# Patient Record
Sex: Female | Born: 1990 | Hispanic: Yes | Marital: Single | Smoking: Former smoker
Health system: Southern US, Community
[De-identification: ages and names within clinical notes are randomized; demographics above are authoritative.]

---
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
## 2014-10-18 ENCOUNTER — Emergency Department: Payer: Self-pay | Admitting: Internal Medicine

## 2014-10-18 LAB — URINALYSIS, COMPLETE
BILIRUBIN, UR: NEGATIVE
Blood: NEGATIVE
Ketone: NEGATIVE
Leukocyte Esterase: NEGATIVE
NITRITE: NEGATIVE
Ph: 7 (ref 4.5–8.0)
Protein: NEGATIVE
RBC,UR: 4 /HPF (ref 0–5)
SPECIFIC GRAVITY: 1.023 (ref 1.003–1.030)
Squamous Epithelial: 4

## 2014-10-18 LAB — CBC
HCT: 38.8 % (ref 35.0–47.0)
HGB: 13.1 g/dL (ref 12.0–16.0)
MCH: 30.9 pg (ref 26.0–34.0)
MCHC: 33.7 g/dL (ref 32.0–36.0)
MCV: 92 fL (ref 80–100)
Platelet: 243 10*3/uL (ref 150–440)
RBC: 4.23 10*6/uL (ref 3.80–5.20)
RDW: 13.3 % (ref 11.5–14.5)
WBC: 6.3 10*3/uL (ref 3.6–11.0)

## 2014-10-18 LAB — HCG, QUANTITATIVE, PREGNANCY: BETA HCG, QUANT.: 39586 m[IU]/mL — AB

## 2014-10-20 LAB — URINE CULTURE

## 2014-12-02 ENCOUNTER — Observation Stay: Payer: Self-pay | Admitting: Obstetrics & Gynecology

## 2014-12-31 ENCOUNTER — Inpatient Hospital Stay
Admit: 2014-12-31 | Disposition: A | Discharge status: Other Acute Inpatient Hospital | Payer: Self-pay | Attending: Certified Nurse Midwife | Admitting: Certified Nurse Midwife

## 2014-12-31 LAB — CBC WITH DIFFERENTIAL/PLATELET
BASOS PCT: 0.2 %
Basophil #: 0 10*3/uL (ref 0.0–0.1)
EOS PCT: 1.9 %
Eosinophil #: 0.2 10*3/uL (ref 0.0–0.7)
HCT: 38.2 % (ref 35.0–47.0)
HGB: 12.9 g/dL (ref 12.0–16.0)
Lymphocyte #: 1.4 10*3/uL (ref 1.0–3.6)
Lymphocyte %: 14.6 %
MCH: 30.8 pg (ref 26.0–34.0)
MCHC: 33.8 g/dL (ref 32.0–36.0)
MCV: 91 fL (ref 80–100)
MONO ABS: 0.5 x10 3/mm (ref 0.2–0.9)
MONOS PCT: 5.2 %
NEUTROS PCT: 78.1 %
Neutrophil #: 7.3 10*3/uL — ABNORMAL HIGH (ref 1.4–6.5)
Platelet: 253 10*3/uL (ref 150–440)
RBC: 4.2 10*6/uL (ref 3.80–5.20)
RDW: 13.8 % (ref 11.5–14.5)
WBC: 9.4 10*3/uL (ref 3.6–11.0)

## 2015-01-09 ENCOUNTER — Observation Stay
Admit: 2015-01-09 | Disposition: A | Discharge status: Other Acute Inpatient Hospital | Payer: Self-pay | Attending: Advanced Practice Midwife | Admitting: Advanced Practice Midwife

## 2015-01-09 ENCOUNTER — Ambulatory Visit (HOSPITAL_COMMUNITY)
Admission: AD | Admit: 2015-01-09 | Discharge: 2015-01-09 | Disposition: A | Discharge status: Home / Self Care | Payer: Medicaid Other | Source: Other Acute Inpatient Hospital | Attending: Obstetrics and Gynecology | Admitting: Obstetrics and Gynecology

## 2015-01-09 DIAGNOSIS — Z3A00 Weeks of gestation of pregnancy not specified: Secondary | ICD-10-CM | POA: Diagnosis not present

## 2015-01-09 DIAGNOSIS — O4410 Placenta previa with hemorrhage, unspecified trimester: Secondary | ICD-10-CM | POA: Diagnosis present

## 2015-01-09 LAB — CBC WITH DIFFERENTIAL/PLATELET
BASOS ABS: 0 10*3/uL (ref 0.0–0.1)
Basophil %: 0.5 %
EOS ABS: 0.1 10*3/uL (ref 0.0–0.7)
EOS PCT: 1.3 %
HCT: 38.6 % (ref 35.0–47.0)
HGB: 13.1 g/dL (ref 12.0–16.0)
LYMPHS PCT: 14.5 %
Lymphocyte #: 1.5 10*3/uL (ref 1.0–3.6)
MCH: 30.9 pg (ref 26.0–34.0)
MCHC: 34.1 g/dL (ref 32.0–36.0)
MCV: 91 fL (ref 80–100)
MONO ABS: 0.6 x10 3/mm (ref 0.2–0.9)
MONOS PCT: 6.2 %
NEUTROS ABS: 8 10*3/uL — AB (ref 1.4–6.5)
Neutrophil %: 77.5 %
Platelet: 255 10*3/uL (ref 150–440)
RBC: 4.26 10*6/uL (ref 3.80–5.20)
RDW: 13.4 % (ref 11.5–14.5)
WBC: 10.3 10*3/uL (ref 3.6–11.0)

## 2015-02-04 NOTE — H&P (Signed)
L&D Evaluation:  History Expanded:  HPI Pt is a 24 yo G1 with EDD=04/10/2015 by a 15wk5d ultrasound, presents at 27w 0d with c/o passing blood clot this am and mild cramping last night and this morning. Denies cramping currently. No LOF, ctx or decreased FM. Pt has a complete placenta previa and this is her third presentation to L&D for bleeding. She was seen most recently on April 5th at 227w5d, received steroids and was [facility]. She signed out AMA on April 8th as she had not had further bleeding and she felt that she was okay to go home, despite risks being reviewed with her at length. She had her first episode of bleeding at 21 wk5 days. She has been on pelvic rest since her first episiode of bleeding. She denies recent intercourse. Her prenatal course is also significant for late entry to care, obesity with a BMI of 35, and smoker. Hx of alcohol abuse and DUI in 12/2012. She is AB+, RNI, VI.   Presents with vaginal bleeding   Patient's Medical History No Chronic Illness   Patient's Surgical History none   Medications Pre Natal Vitamins   Allergies NKDA   Social History ETOH prior to pregnancy./ Quit smoking prior to pregnancy   Family History Non-Contributory   ROS:  ROS see HPI   Exam:  Vital Signs stable   Urine Protein not completed   General no apparent distress   Mental Status clear   Heart no murmur/gallop/rubs   Abdomen gravid, non-tender   Edema no edema   Reflexes +1 to +2   Pelvic no external lesions, SSE: moderate amt dark red blood and 1 cm clot in vaginal vault and coming from the cervix. CX appears to be closed.   Mebranes Intact   FHT normal rate with no decels, baseline 150, appropriate for gestational age   Ucx absent   Skin dry   Impression:  Impression IUP at 27 weeks, complete placenta previa, 3rd bleeding episode   Plan:  Comments Discussed possible transfer again to [facility], pt resistant as it's "so far from family and I have to  be there by myself".  Again reviewed risks of bleeding to fetus and the possible need for emergency delivery in the event of heavy bleeding. Will review with Dr Vergie LivingPickens to further discuss possible POM.   Electronic Signatures: Masae Lukacs, Marta Lamasamara K (CNM)  (Signed 14-Apr-16 12:58)  Authored: L&D Evaluation   Last Updated: 14-Apr-16 12:58 by Vella KohlerBrothers, Tarquin Welcher K (CNM)

## 2015-02-04 NOTE — H&P (Signed)
L&D Evaluation:  History:  HPI Pt is a 24 yo G1 at 25wk5d GA ,  EDC=04/10/2015 by a 15wk5d ultrasound with a hx of complete placenta previa who presents to L&D with a second episode of bleeding. She had her first episode of bleeding at 21 wk5 days.  She reports bright red blood on her pad this Am when she awoke to go to [facility]. She also had blood on the toliet tissue when she wiped and a small amt of blood in the toliet.  When she arrived, there was blood on the middle part of her pad and she could feel the blood trickle out.  She denies intercourse or  trauma. She treated herself for a yeast infection with Monistat 1 a couple of weeks ago. Has been having a brownish yellowish discharge.  She has been on pelvic rest since her first episiode of bleeding. She denies any abdominal pain or ctxs. Baby active. Her prenatal course is also significant for late entry to care, obesity with a BMI of 35, and smoker. Hx of alcohol abuse and DUI in 12/2012. Attending classes to have her liscense reinstated. She is AB+, RNI, VI.   Presents with vaginal bleeding   Patient's Medical History No Chronic Illness   Patient's Surgical History none   Medications Pre Natal Vitamins   Allergies NKDA   Social History tobacco  ETOH prior to pregnancy./ Quit smoking prior to pregnancy   Family History Non-Contributory   ROS:  ROS see HPI   Exam:  Vital Signs 126/75 98.1-99-18    Urine Protein not completed   General no apparent distress   Mental Status clear   Chest clear    Heart normal sinus rhythm, no murmur/gallop/rubs   Abdomen gravid, non-tender   Fetal Position cephalic on US. Complete previa again visualized   Edema no edema    Reflexes +1 to +2    Pelvic no external lesions, SSE: small amt dark red blood in vaginal vault and coming from the cxs. CX appears to be closed.   Mebranes Intact   FHT normal rate with no decels, 140, age appropriate tracing   Ucx absent   Skin dry   Other wet  prep negative.   Impression:  Impression IUP at 25.5 weeks with complete previa and second episode of bleeding.. Stable   Plan:  Plan EFM/NST, monitor bleeding-consider transfer to tertiery center. CBC, KB, T&S done.  IV started and a second saline lock inserted.  Steroids begun.   Comments Had discussion with patient regarding bleeding from placenta previa and significance. Aware that delivery would be via a C-section if previa persists, and that often bleeding may necessitate premature delivery. Discussed some of the problems with prematurity for the baby especially at such an early gestation and the benefits of steroids for the production of surfactant. Discussed that often transfer to a tertiery center with higher level NICU and the ability to do a C-section within a few minutes is indicated at this early gestation.   Electronic Signatures: Trinna BalloonGutierrez, Yeiren Whitecotton L (CNM)  (Signed 05-Apr-16 10:06)  Authored: L&D Evaluation   Last Updated: 05-Apr-16 10:06 by Trinna BalloonGutierrez, Vonita Calloway L (CNM)

## 2015-02-04 NOTE — H&P (Signed)
L&D Evaluation:  History:  HPI Pt is a 24 yo G1 at 3858w4d GA pt of [facility] with an EDC of 04/10/15 based off of a 2930w5d u/s who presents to L&D with reports of bleeding on and off for 2 weeks. She reports bright red blood on her pad. When she arrived, she had 2 silver dollars worth of blood on a pad as well as blood in the toilet on the toilet paper when she wiped. She denies intercourse, trauma, or infection. She had an ultrasound in January that showed a complete placenta previa. Her prenatal course is also significant for late entry to care, obesity with a BMI of 35, and smoker. She is AB+, RNI, VI.   Presents with vaginal bleeding   Patient's Medical History No Chronic Illness   Patient's Surgical History none   Medications Pre Natal Vitamins   Allergies NKDA   Social History tobacco   Family History Non-Contributory   ROS:  ROS All systems were reviewed.  HEENT, CNS, GI, GU, Respiratory, CV, Renal and Musculoskeletal systems were found to be normal.   Exam:  Vital Signs stable   General no apparent distress   Mental Status clear   Chest clear   Heart normal sinus rhythm   Abdomen gravid, non-tender   Back no CVAT   Pelvic no external lesions, appears closed on u/s- was not measured by sonographer   Mebranes Intact   FHT 145 on doppler   Ucx absent   Skin dry, no lesions, no rashes   Lymph no lymphadenopathy   Other During u/s pt had some blood in the toilet and when she wiped that " as a lot" as noted by the u/s tech. She then had a golf ball sized amount of bleeding on her pad in L&D. Since 3 am she has not had any more bleeding on her pad as reported by the RN.   Impression:  Impression IUP at 2658w4d, complete placenta previa with vaginal bleeding   Plan:  Plan monitor for increase in vaginal bleeding. COnsider discharge when bleeding is stable   Follow Up Appointment need to schedule   Electronic Signatures: Jannet MantisSubudhi, Lovett Coffin (CNM)  (Signed 08-Mar-16  07:01)  Authored: L&D Evaluation   Last Updated: 08-Mar-16 07:01 by Jannet MantisSubudhi, Abrahim Sargent (CNM)

## 2017-03-12 ENCOUNTER — Observation Stay
Admission: EM | Admit: 2017-03-12 | Discharge: 2017-03-12 | Disposition: A | Discharge status: Home / Self Care | Payer: BLUE CROSS/BLUE SHIELD | Attending: Obstetrics & Gynecology | Admitting: Obstetrics & Gynecology

## 2017-03-12 DIAGNOSIS — Z3490 Encounter for supervision of normal pregnancy, unspecified, unspecified trimester: Secondary | ICD-10-CM

## 2017-03-12 DIAGNOSIS — Z3A23 23 weeks gestation of pregnancy: Secondary | ICD-10-CM | POA: Diagnosis not present

## 2017-03-12 DIAGNOSIS — O9989 Other specified diseases and conditions complicating pregnancy, childbirth and the puerperium: Secondary | ICD-10-CM

## 2017-03-12 DIAGNOSIS — R1030 Lower abdominal pain, unspecified: Secondary | ICD-10-CM | POA: Diagnosis not present

## 2017-03-12 DIAGNOSIS — O26899 Other specified pregnancy related conditions, unspecified trimester: Secondary | ICD-10-CM | POA: Diagnosis present

## 2017-03-12 DIAGNOSIS — R109 Unspecified abdominal pain: Secondary | ICD-10-CM

## 2017-03-12 LAB — URINALYSIS, ROUTINE W REFLEX MICROSCOPIC
Bilirubin Urine: NEGATIVE
GLUCOSE, UA: NEGATIVE mg/dL
Hgb urine dipstick: NEGATIVE
Ketones, ur: 5 mg/dL — AB
Nitrite: NEGATIVE
PH: 6 (ref 5.0–8.0)
Protein, ur: 30 mg/dL — AB
Specific Gravity, Urine: 1.024 (ref 1.005–1.030)

## 2017-03-12 MED ORDER — ACETAMINOPHEN 325 MG PO TABS: 650.0000 mg | ORAL_TABLET | ORAL | Status: DC | PRN

## 2017-03-12 MED ORDER — ONDANSETRON HCL 4 MG/2ML IJ SOLN: 4.0000 mg | Freq: Four times a day (QID) | INTRAMUSCULAR | Status: DC | PRN

## 2017-03-12 NOTE — Discharge Summary (Signed)
  See FPN 

## 2017-03-12 NOTE — OB Triage Note (Signed)
Patient presents to triage for complaints of lower abdominal pain, states that this pain is intermittent and lasts for abut 30 -40 seconds, is shooting all in lower abdomen. No leaking of fluids, no bleeding, fetal movement has been good.

## 2017-03-12 NOTE — Final Progress Note (Signed)
Physician Final Progress Note  Patient ID: Raven SonsBrenda Knippel MRN: [id number] DOB/AGE: [birth year] 26 y.o.  Admit date: 03/12/2017 Admitting provider: Nadara Mustardobert P Renee Beale, MD Discharge date: 03/12/2017   Admission Diagnoses: Lower abdominal pain, [redacted] weeks pregnant  Discharge Diagnoses:  Active Problems:   Pregnancy   Abdominal pain affecting pregnancy   Consults: None  Significant Findings/ Diagnostic Studies: Pt seen and examined by nurse.  Patient presented for evaluation of pain or labor.  Patient had  exam by RN and this was reported to me. I reviewed her vital signs and fetal tracing, both of which were reassuring.  UA performed with min WBC's.  Patient was discharge as she was not laboring.  Procedures: FHT 140s  Discharge Condition: good  Disposition: 01-Home or Self Care  Diet: Regular diet  Discharge Activity: Activity as tolerated   Allergies as of 03/12/2017   No Known Allergies     Medication List    TAKE these medications   prenatal vitamin w/FE, FA 27-1 MG Tabs tablet Take 1 tablet by mouth daily at 12 noon.      Follow-up Information    Department, [facility] Follow up.   Why:  as scheduled   Contact information: [address] [place] [id number] [phone]           Total time spent taking care of this patient: TRIAGE  Signed: Letitia LibraRobert Paul Siaosi Alter 03/12/2017, 7:56 PM

## 2017-09-28 ENCOUNTER — Encounter (HOSPITAL_COMMUNITY): Payer: Self-pay

## 2017-12-14 ENCOUNTER — Encounter: Payer: Self-pay | Admitting: Certified Nurse Midwife

## 2017-12-24 ENCOUNTER — Emergency Department
Admission: EM | Admit: 2017-12-24 | Discharge: 2017-12-24 | Disposition: A | Discharge status: Home / Self Care | Payer: BLUE CROSS/BLUE SHIELD | Attending: Emergency Medicine | Admitting: Emergency Medicine

## 2017-12-24 DIAGNOSIS — K2900 Acute gastritis without bleeding: Secondary | ICD-10-CM | POA: Diagnosis not present

## 2017-12-24 DIAGNOSIS — Z87891 Personal history of nicotine dependence: Secondary | ICD-10-CM | POA: Insufficient documentation

## 2017-12-24 DIAGNOSIS — R1010 Upper abdominal pain, unspecified: Secondary | ICD-10-CM

## 2017-12-24 LAB — CBC
HEMATOCRIT: 43.3 % (ref 35.0–47.0)
HEMOGLOBIN: 14.8 g/dL (ref 12.0–16.0)
MCH: 30.5 pg (ref 26.0–34.0)
MCHC: 34.1 g/dL (ref 32.0–36.0)
MCV: 89.3 fL (ref 80.0–100.0)
Platelets: 268 10*3/uL (ref 150–440)
RBC: 4.86 MIL/uL (ref 3.80–5.20)
RDW: 13 % (ref 11.5–14.5)
WBC: 9.1 10*3/uL (ref 3.6–11.0)

## 2017-12-24 LAB — COMPREHENSIVE METABOLIC PANEL
ALBUMIN: 4.1 g/dL (ref 3.5–5.0)
ALT: 39 U/L (ref 14–54)
ANION GAP: 9 (ref 5–15)
AST: 24 U/L (ref 15–41)
Alkaline Phosphatase: 53 U/L (ref 38–126)
BILIRUBIN TOTAL: 0.5 mg/dL (ref 0.3–1.2)
BUN: 17 mg/dL (ref 6–20)
CO2: 22 mmol/L (ref 22–32)
Calcium: 9 mg/dL (ref 8.9–10.3)
Chloride: 106 mmol/L (ref 101–111)
Creatinine, Ser: 0.42 mg/dL — ABNORMAL LOW (ref 0.44–1.00)
GFR calc Af Amer: >60 mL/min (ref >60)
Glucose, Bld: 114 mg/dL — ABNORMAL HIGH (ref 65–99)
POTASSIUM: 3.8 mmol/L (ref 3.5–5.1)
Sodium: 137 mmol/L (ref 135–145)
TOTAL PROTEIN: 7.5 g/dL (ref 6.5–8.1)

## 2017-12-24 LAB — URINALYSIS, COMPLETE (UACMP) WITH MICROSCOPIC
BACTERIA UA: NONE SEEN
BILIRUBIN URINE: NEGATIVE
Glucose, UA: NEGATIVE mg/dL
HGB URINE DIPSTICK: NEGATIVE
Ketones, ur: NEGATIVE mg/dL
LEUKOCYTES UA: NEGATIVE
NITRITE: NEGATIVE
PROTEIN: NEGATIVE mg/dL
Specific Gravity, Urine: 1.029 (ref 1.005–1.030)
pH: 6 (ref 5.0–8.0)

## 2017-12-24 LAB — LIPASE, BLOOD: Lipase: 34 U/L (ref 11–51)

## 2017-12-24 LAB — POCT PREGNANCY, URINE: PREG TEST UR: NEGATIVE

## 2017-12-24 MED ORDER — FAMOTIDINE 40 MG PO TABS: 40.0000 mg | ORAL_TABLET | Freq: Every evening | ORAL | 0 refills | Status: AC

## 2017-12-24 MED ORDER — GI COCKTAIL ~~LOC~~
30.0000 mL | Freq: Once | ORAL | Status: AC
  Administered 2017-12-24: 30 mL via ORAL
  Filled 2017-12-24: qty 30

## 2017-12-24 NOTE — ED Provider Notes (Signed)
[facility] Emergency Department Provider Note  ____________________________________________   First MD Initiated Contact with Patient 12/24/17 [phone]     (approximate)  I have reviewed the triage vital signs and the nursing notes.   HISTORY  Chief Complaint Abdominal Pain   HPI Raven Simpson is a 27 y.o. female without any chronic medical conditions was presenting to the emergency department today with upper abdominal pain that started hours ago.  She says that the pain is a 9 out of 10 burning sensation to the epigastrium that radiates through to her back as well as to the bilateral flanks.  She denies any burning with urination.  Says that she is nauseous but is not vomiting.  Denies any diarrhea.  Does not report any vaginal bleeding or discharge.  Denies history of reflux but does say that the pain worsens when she lies back.  Denies the pain radiating up into her chest.  History reviewed. No pertinent past medical history.  Patient Active Problem List   Diagnosis Date Noted  . Pregnancy 03/12/2017  . Abdominal pain affecting pregnancy 03/12/2017    History reviewed. No pertinent surgical history.  Prior to Admission medications   Medication Sig Start Date End Date Taking? Authorizing Provider  prenatal vitamin w/FE, FA (PRENATAL 1 + 1) 27-1 MG TABS tablet Take 1 tablet by mouth daily at 12 noon.    [provider]    Allergies Patient has no known allergies.  No family history on file.  Social History Social History   Tobacco Use  . Smoking status: Former Games developermoker  . Smokeless tobacco: Never Used  Substance Use Topics  . Alcohol use: Not Currently  . Drug use: Not on file    Review of Systems  Constitutional: No fever/chills Eyes: No visual changes. ENT: No sore throat. Cardiovascular: Denies chest pain. Respiratory: Denies shortness of breath. Gastrointestinal:  no vomiting.  No diarrhea.  No constipation. Genitourinary:  Negative for dysuria. Musculoskeletal: As above Skin: Negative for rash. Neurological: Negative for headaches, focal weakness or numbness.   ____________________________________________   PHYSICAL EXAM:  VITAL SIGNS: ED Triage Vitals  Enc Vitals Group     BP 12/24/17 0611 (!) 136/93     Pulse Rate 12/24/17 0611 93     Resp 12/24/17 0611 16     Temp 12/24/17 0611 97.9 F (36.6 C)     Temp Source 12/24/17 0611 Oral     SpO2 12/24/17 0611 96 %     Weight 12/24/17 0600 185 lb (83.9 kg)     Height 12/24/17 0600 5\' 1"  (1.549 m)     Head Circumference --      Peak Flow --      Pain Score 12/24/17 0600 9     Pain Loc --      Pain Edu? --      Excl. in GC? --     Constitutional: Alert and oriented. Well appearing and in no acute distress. Eyes: Conjunctivae are normal.  Head: Atraumatic. Nose: No congestion/rhinnorhea. Mouth/Throat: Mucous membranes are moist.  Neck: No stridor.   Cardiovascular: Normal rate, regular rhythm. Grossly normal heart sounds.  Respiratory: Normal respiratory effort.  No retractions. Lungs CTAB. Gastrointestinal: Soft with moderate epigastric tenderness to palpation as well as mild to moderate tenderness to the right upper and left upper quadrants with a negative Murphy sign.  No lower abdominal tenderness to palpation.  No rebound or guarding. No distention. No CVA tenderness. Musculoskeletal: No lower extremity  tenderness nor edema.  No joint effusions. Neurologic:  Normal speech and language. No gross focal neurologic deficits are appreciated. Skin:  Skin is warm, dry and intact. No rash noted. Psychiatric: Mood and affect are normal. Speech and behavior are normal.  ____________________________________________   LABS (all labs ordered are listed, but only abnormal results are displayed)  Labs Reviewed  COMPREHENSIVE METABOLIC PANEL - Abnormal; Notable for the following components:      Result Value   Glucose, Bld 114 (*)    Creatinine, Ser  0.42 (*)    All other components within normal limits  URINALYSIS, COMPLETE (UACMP) WITH MICROSCOPIC - Abnormal; Notable for the following components:   Color, Urine YELLOW (*)    APPearance HAZY (*)    Squamous Epithelial / LPF 6-30 (*)    All other components within normal limits  LIPASE, BLOOD  CBC  POC URINE PREG, ED  POCT PREGNANCY, URINE   ____________________________________________  EKG  ED ECG REPORT I, Arelia Longest, the attending physician, personally viewed and interpreted this ECG.   Date: 12/24/2017  EKG Time: 0611  Rate: 85  Rhythm: normal sinus rhythm  Axis: Normal  Intervals:none  ST&T Change: No ST segment elevation or depression.  No abnormal T wave inversion.  ____________________________________________  RADIOLOGY   ____________________________________________   PROCEDURES  Procedure(s) performed:   Procedures  Critical Care performed:   ____________________________________________   INITIAL IMPRESSION / ASSESSMENT AND PLAN / ED COURSE  Pertinent labs & imaging results that were available during my care of the patient were reviewed by me and considered in my medical decision making (see chart for details).  Differential diagnosis includes, but is not limited to, biliary disease (biliary colic, acute cholecystitis, cholangitis, choledocholithiasis, etc), intrathoracic causes for epigastric abdominal pain including ACS, gastritis, duodenitis, pancreatitis, small bowel or large bowel obstruction, abdominal aortic aneurysm, hernia, and gastritis. As part of my medical decision making, I reviewed the following data within the electronic MEDICAL RECORD NUMBER Notes from prior ED visits  ----------------------------------------- 6:44 AM on 12/24/2017 -----------------------------------------  Patient is pain-free after the GI cocktail.  Says that she also burped several times and now can feel her stomach gurgling.  I reexamined her abdomen and  she is nontender throughout.  Possible gas pains versus bloating versus reflux.  Will be discharged home with Pepcid.  Will follow up with her primary care Dr. Phineas Real.  We discussed return precautions such as any return of her abdominal pain or for any worsening or concerning symptoms.  Patient is understanding of the diagnosis as well as treatment plan willing to comply. ____________________________________________   FINAL CLINICAL IMPRESSION(S) / ED DIAGNOSES  Gastritis.    NEW MEDICATIONS STARTED DURING THIS VISIT:  New Prescriptions   No medications on file     Note:  This document was prepared using Dragon voice recognition software and may include unintentional dictation errors.     Myrna Blazer, MD 12/24/17 [id number]

## 2017-12-24 NOTE — ED Notes (Signed)
Pt reports mid abdominal pain that radiates to her back.  Pt is alert and oriented x 4.

## 2017-12-24 NOTE — ED Triage Notes (Addendum)
Patient c/o burning epigastric pain radiating to back beginning at 0230 today. Patient reports nausea, denies vomiting.

## 2018-09-04 ENCOUNTER — Encounter: Payer: Self-pay | Admitting: Emergency Medicine

## 2018-09-04 ENCOUNTER — Other Ambulatory Visit: Payer: Self-pay

## 2018-09-04 ENCOUNTER — Emergency Department
Admission: EM | Admit: 2018-09-04 | Discharge: 2018-09-04 | Disposition: A | Discharge status: Home / Self Care | Payer: BLUE CROSS/BLUE SHIELD | Attending: Emergency Medicine | Admitting: Emergency Medicine

## 2018-09-04 ENCOUNTER — Emergency Department: Payer: BLUE CROSS/BLUE SHIELD

## 2018-09-04 DIAGNOSIS — R002 Palpitations: Secondary | ICD-10-CM | POA: Diagnosis present

## 2018-09-04 DIAGNOSIS — Z87891 Personal history of nicotine dependence: Secondary | ICD-10-CM | POA: Diagnosis not present

## 2018-09-04 DIAGNOSIS — F419 Anxiety disorder, unspecified: Secondary | ICD-10-CM | POA: Diagnosis not present

## 2018-09-04 DIAGNOSIS — Z79899 Other long term (current) drug therapy: Secondary | ICD-10-CM | POA: Diagnosis not present

## 2018-09-04 LAB — BASIC METABOLIC PANEL
Anion gap: 7 (ref 5–15)
BUN: 10 mg/dL (ref 6–20)
CO2: 23 mmol/L (ref 22–32)
Calcium: 9.1 mg/dL (ref 8.9–10.3)
Chloride: 106 mmol/L (ref 98–111)
Creatinine, Ser: 0.36 mg/dL — ABNORMAL LOW (ref 0.44–1.00)
GFR calc Af Amer: >60 mL/min (ref >60)
GLUCOSE: 102 mg/dL — AB (ref 70–99)
POTASSIUM: 3.5 mmol/L (ref 3.5–5.1)
Sodium: 136 mmol/L (ref 135–145)

## 2018-09-04 LAB — CBC
HCT: 45.6 % (ref 36.0–46.0)
Hemoglobin: 15.5 g/dL — ABNORMAL HIGH (ref 12.0–15.0)
MCH: 31.1 pg (ref 26.0–34.0)
MCHC: 34 g/dL (ref 30.0–36.0)
MCV: 91.4 fL (ref 80.0–100.0)
Platelets: 266 10*3/uL (ref 150–400)
RBC: 4.99 MIL/uL (ref 3.87–5.11)
RDW: 12.5 % (ref 11.5–15.5)
WBC: 9.5 10*3/uL (ref 4.0–10.5)
nRBC: 0 % (ref 0.0–0.2)

## 2018-09-04 LAB — TROPONIN I

## 2018-09-04 NOTE — ED Provider Notes (Signed)
[facility] Emergency Department Provider Note   ____________________________________________    I have reviewed the triage vital signs and the nursing notes.   HISTORY  Chief Complaint Palpitations     HPI Raven Simpson is a 27 y.o. female who presents with complaints of palpitation and anxiety.  Patient reports earlier today around noon she started feeling very anxious and emotional and then developed palpitations and "a sense of doom ".  No chest pain.  No shortness of breath.  Currently she feels much better and has no symptoms.  She reports this is been happening in the past and she does see psychiatry for anxiety which she thinks is the cause of these symptoms.  No calf pain, no pleurisy, no recent travel.   History reviewed. No pertinent past medical history.  Patient Active Problem List   Diagnosis Date Noted  . Pregnancy 03/12/2017  . Abdominal pain affecting pregnancy 03/12/2017    History reviewed. No pertinent surgical history.  Prior to Admission medications   Medication Sig Start Date End Date Taking? Authorizing Provider  famotidine (PEPCID) 40 MG tablet Take 1 tablet (40 mg total) by mouth every evening. 12/24/17 12/24/18  Schaevitz, Myra Rude, MD  prenatal vitamin w/FE, FA (PRENATAL 1 + 1) 27-1 MG TABS tablet Take 1 tablet by mouth daily at 12 noon.    [provider]     Allergies Patient has no known allergies.  No family history on file.  Social History Social History   Tobacco Use  . Smoking status: Former Games developer  . Smokeless tobacco: Never Used  Substance Use Topics  . Alcohol use: Not Currently  . Drug use: Not on file    Review of Systems  Constitutional: No fever/chills Eyes: No visual changes.  ENT: No sore throat. Cardiovascular: As above Respiratory: Denies shortness of breath. Gastrointestinal: No abdominal pain.   Genitourinary: Negative for dysuria. Musculoskeletal: Negative for back  pain. Skin: Negative for rash. Neurological: Negative for headaches    ____________________________________________   PHYSICAL EXAM:  VITAL SIGNS: ED Triage Vitals  Enc Vitals Group     BP 09/04/18 1615 123/86     Pulse Rate 09/04/18 1615 (!) 101     Resp 09/04/18 1615 20     Temp 09/04/18 1615 99.3 F (37.4 C)     Temp Source 09/04/18 1615 Oral     SpO2 09/04/18 1615 99 %     Weight 09/04/18 1615 86.2 kg (190 lb)     Height 09/04/18 1615 1.549 m (5\' 1" )     Head Circumference --      Peak Flow --      Pain Score 09/04/18 1619 0     Pain Loc --      Pain Edu? --      Excl. in GC? --     Constitutional: Alert and oriented. No acute distress. Pleasant and interactive Eyes: Conjunctivae are normal.   Nose: No congestion/rhinnorhea. Mouth/Throat: Mucous membranes are moist.    Cardiovascular: Normal rate, regular rhythm. Grossly normal heart sounds.  Good peripheral circulation. Respiratory: Normal respiratory effort.  No retractions. Lungs CTAB. Gastrointestinal: Soft and nontender. No distention.    Musculoskeletal: No lower extremity tenderness nor edema.  Warm and well perfused Neurologic:  Normal speech and language. No gross focal neurologic deficits are appreciated.  Skin:  Skin is warm, dry and intact. No rash noted. Psychiatric: Mood and affect are normal. Speech and behavior are normal.  ____________________________________________  LABS (all labs ordered are listed, but only abnormal results are displayed)  Labs Reviewed  BASIC METABOLIC PANEL - Abnormal; Notable for the following components:      Result Value   Glucose, Bld 102 (*)    Creatinine, Ser 0.36 (*)    All other components within normal limits  CBC - Abnormal; Notable for the following components:   Hemoglobin 15.5 (*)    All other components within normal limits  TROPONIN I   ____________________________________________  EKG  ED ECG REPORT I, Jene Everyobert Gen Clagg, the attending physician,  personally viewed and interpreted this ECG.  Date: 09/04/2018  Rhythm: normal sinus rhythm QRS Axis: normal Intervals: normal ST/T Wave abnormalities: normal Narrative Interpretation: no evidence of acute ischemia  ____________________________________________  RADIOLOGY  Chest x-ray normal ____________________________________________   PROCEDURES  Procedure(s) performed: No  Procedures   Critical Care performed: No ____________________________________________   INITIAL IMPRESSION / ASSESSMENT AND PLAN / ED COURSE  Pertinent labs & imaging results that were available during my care of the patient were reviewed by me and considered in my medical decision making (see chart for details).  Patient presents with reports of palpitations preceded by significant anxiety.  She reports she has been going through family difficulties currently and she thinks this is the cause of her anxiety.  Certainly symptoms are suspicious for anxiety attack.  Lab work is reassuring, EKG unremarkable, chest x-ray unremarkable, exam is normal.  She is asymptomatic here, will refer her for further outpatient work-up    ____________________________________________   FINAL CLINICAL IMPRESSION(S) / ED DIAGNOSES  Final diagnoses:  Palpitations  Anxiety        Note:  This document was prepared using Dragon voice recognition software and may include unintentional dictation errors.    Jene EveryKinner, Juelz Claar, MD 09/04/18 2233

## 2018-09-04 NOTE — ED Triage Notes (Signed)
Palpitations today. Denies cough, fever or body aches.

## 2019-09-22 ENCOUNTER — Other Ambulatory Visit: Payer: Self-pay

## 2019-09-22 ENCOUNTER — Observation Stay: Payer: Medicaid Other | Admitting: Certified Registered"

## 2019-09-22 ENCOUNTER — Encounter
Admission: EM | Disposition: A | Discharge status: Home / Self Care | Payer: Self-pay | Source: Home / Self Care | Attending: Emergency Medicine

## 2019-09-22 ENCOUNTER — Observation Stay
Admission: EM | Admit: 2019-09-22 | Discharge: 2019-09-23 | Disposition: A | Discharge status: Home / Self Care | Payer: Medicaid Other | Attending: General Surgery | Admitting: General Surgery

## 2019-09-22 ENCOUNTER — Emergency Department: Payer: Medicaid Other

## 2019-09-22 ENCOUNTER — Encounter: Payer: Self-pay | Admitting: General Surgery

## 2019-09-22 DIAGNOSIS — K8012 Calculus of gallbladder with acute and chronic cholecystitis without obstruction: Principal | ICD-10-CM | POA: Insufficient documentation

## 2019-09-22 DIAGNOSIS — Z87891 Personal history of nicotine dependence: Secondary | ICD-10-CM | POA: Diagnosis not present

## 2019-09-22 DIAGNOSIS — Z20828 Contact with and (suspected) exposure to other viral communicable diseases: Secondary | ICD-10-CM | POA: Insufficient documentation

## 2019-09-22 DIAGNOSIS — R1013 Epigastric pain: Secondary | ICD-10-CM

## 2019-09-22 DIAGNOSIS — K219 Gastro-esophageal reflux disease without esophagitis: Secondary | ICD-10-CM | POA: Diagnosis not present

## 2019-09-22 DIAGNOSIS — Z79899 Other long term (current) drug therapy: Secondary | ICD-10-CM | POA: Insufficient documentation

## 2019-09-22 DIAGNOSIS — K810 Acute cholecystitis: Secondary | ICD-10-CM | POA: Diagnosis present

## 2019-09-22 HISTORY — PX: CHOLECYSTECTOMY: SHX55

## 2019-09-22 LAB — COMPREHENSIVE METABOLIC PANEL
ALT: 49 U/L — ABNORMAL HIGH (ref 0–44)
AST: 23 U/L (ref 15–41)
Albumin: 4.4 g/dL (ref 3.5–5.0)
Alkaline Phosphatase: 57 U/L (ref 38–126)
Anion gap: 12 (ref 5–15)
BUN: 20 mg/dL (ref 6–20)
CO2: 22 mmol/L (ref 22–32)
Calcium: 9.1 mg/dL (ref 8.9–10.3)
Chloride: 105 mmol/L (ref 98–111)
Creatinine, Ser: 0.47 mg/dL (ref 0.44–1.00)
GFR calc Af Amer: >60 mL/min (ref >60)
GFR calc non Af Amer: >60 mL/min (ref >60)
Glucose, Bld: 141 mg/dL — ABNORMAL HIGH (ref 70–99)
Potassium: 3.6 mmol/L (ref 3.5–5.1)
Sodium: 139 mmol/L (ref 135–145)
Total Bilirubin: 0.4 mg/dL (ref 0.3–1.2)
Total Protein: 7.5 g/dL (ref 6.5–8.1)

## 2019-09-22 LAB — CBC
HCT: 42.2 % (ref 36.0–46.0)
Hemoglobin: 15 g/dL (ref 12.0–15.0)
MCH: 30.7 pg (ref 26.0–34.0)
MCHC: 35.5 g/dL (ref 30.0–36.0)
MCV: 86.5 fL (ref 80.0–100.0)
Platelets: 251 10*3/uL (ref 150–400)
RBC: 4.88 MIL/uL (ref 3.87–5.11)
RDW: 12.5 % (ref 11.5–15.5)
WBC: 11.4 10*3/uL — ABNORMAL HIGH (ref 4.0–10.5)
nRBC: 0 % (ref 0.0–0.2)

## 2019-09-22 LAB — URINALYSIS, COMPLETE (UACMP) WITH MICROSCOPIC
Bilirubin Urine: NEGATIVE
Glucose, UA: NEGATIVE mg/dL
Hgb urine dipstick: NEGATIVE
Ketones, ur: NEGATIVE mg/dL
Nitrite: NEGATIVE
Protein, ur: 30 mg/dL — AB
Specific Gravity, Urine: 1.035 — ABNORMAL HIGH (ref 1.005–1.030)
pH: 5 (ref 5.0–8.0)

## 2019-09-22 LAB — LIPASE, BLOOD: Lipase: 27 U/L (ref 11–51)

## 2019-09-22 LAB — RESPIRATORY PANEL BY RT PCR (FLU A&B, COVID)
Influenza A by PCR: NEGATIVE
Influenza B by PCR: NEGATIVE
SARS Coronavirus 2 by RT PCR: NEGATIVE

## 2019-09-22 LAB — POCT PREGNANCY, URINE: Preg Test, Ur: NEGATIVE

## 2019-09-22 LAB — HIV ANTIBODY (ROUTINE TESTING W REFLEX): HIV Screen 4th Generation wRfx: NONREACTIVE

## 2019-09-22 LAB — SURGICAL PCR SCREEN
MRSA, PCR: NEGATIVE
Staphylococcus aureus: POSITIVE — AB

## 2019-09-22 SURGERY — LAPAROSCOPIC CHOLECYSTECTOMY
Anesthesia: General

## 2019-09-22 MED ORDER — LIDOCAINE HCL (PF) 2 % IJ SOLN
INTRAMUSCULAR | Status: AC
  Filled 2019-09-22: qty 5

## 2019-09-22 MED ORDER — FENTANYL CITRATE (PF) 100 MCG/2ML IJ SOLN: 25.0000 ug | INTRAMUSCULAR | Status: DC | PRN

## 2019-09-22 MED ORDER — IBUPROFEN 400 MG PO TABS
600.0000 mg | ORAL_TABLET | Freq: Four times a day (QID) | ORAL | Status: DC | PRN
  Administered 2019-09-22 – 2019-09-23 (×2): 600 mg via ORAL
  Filled 2019-09-22 (×2): qty 2

## 2019-09-22 MED ORDER — LACTATED RINGERS IV SOLN: INTRAVENOUS | Status: DC | PRN

## 2019-09-22 MED ORDER — DEXAMETHASONE SODIUM PHOSPHATE 10 MG/ML IJ SOLN
INTRAMUSCULAR | Status: DC | PRN
  Administered 2019-09-22: 5 mg via INTRAVENOUS

## 2019-09-22 MED ORDER — ONDANSETRON HCL 4 MG/2ML IJ SOLN: 4.0000 mg | Freq: Four times a day (QID) | INTRAMUSCULAR | Status: DC | PRN

## 2019-09-22 MED ORDER — PIPERACILLIN-TAZOBACTAM 3.375 G IVPB
3.3750 g | Freq: Three times a day (TID) | INTRAVENOUS | Status: DC
  Administered 2019-09-22 – 2019-09-23 (×3): 3.375 g via INTRAVENOUS
  Filled 2019-09-22 (×3): qty 50

## 2019-09-22 MED ORDER — HYDROMORPHONE HCL 1 MG/ML IJ SOLN
INTRAMUSCULAR | Status: DC | PRN
  Administered 2019-09-22 (×2): .5 mg via INTRAVENOUS

## 2019-09-22 MED ORDER — EPHEDRINE SULFATE 50 MG/ML IJ SOLN
INTRAMUSCULAR | Status: DC | PRN
  Administered 2019-09-22: 10 mg via INTRAVENOUS

## 2019-09-22 MED ORDER — MIDAZOLAM HCL 2 MG/2ML IJ SOLN
INTRAMUSCULAR | Status: DC | PRN
  Administered 2019-09-22: 2 mg via INTRAVENOUS

## 2019-09-22 MED ORDER — EPINEPHRINE PF 1 MG/ML IJ SOLN
INTRAMUSCULAR | Status: AC
  Filled 2019-09-22: qty 1

## 2019-09-22 MED ORDER — ENOXAPARIN SODIUM 40 MG/0.4ML ~~LOC~~ SOLN
40.0000 mg | SUBCUTANEOUS | Status: DC
  Administered 2019-09-22: 40 mg via SUBCUTANEOUS
  Filled 2019-09-22: qty 0.4

## 2019-09-22 MED ORDER — BUPIVACAINE HCL (PF) 0.5 % IJ SOLN
INTRAMUSCULAR | Status: AC
  Filled 2019-09-22: qty 30

## 2019-09-22 MED ORDER — ONDANSETRON HCL 4 MG/2ML IJ SOLN
INTRAMUSCULAR | Status: AC
  Filled 2019-09-22: qty 2

## 2019-09-22 MED ORDER — ONDANSETRON HCL 4 MG/2ML IJ SOLN
INTRAMUSCULAR | Status: DC | PRN
  Administered 2019-09-22: 4 mg via INTRAVENOUS

## 2019-09-22 MED ORDER — PROPOFOL 10 MG/ML IV BOLUS
INTRAVENOUS | Status: DC | PRN
  Administered 2019-09-22: 170 mg via INTRAVENOUS

## 2019-09-22 MED ORDER — KETOROLAC TROMETHAMINE 30 MG/ML IJ SOLN
INTRAMUSCULAR | Status: AC
  Filled 2019-09-22: qty 1

## 2019-09-22 MED ORDER — MIDAZOLAM HCL 2 MG/2ML IJ SOLN
INTRAMUSCULAR | Status: AC
  Filled 2019-09-22: qty 2

## 2019-09-22 MED ORDER — ONDANSETRON 4 MG PO TBDP
4.0000 mg | ORAL_TABLET | Freq: Once | ORAL | Status: AC | PRN
  Administered 2019-09-22: 4 mg via ORAL
  Filled 2019-09-22: qty 1

## 2019-09-22 MED ORDER — SUGAMMADEX SODIUM 200 MG/2ML IV SOLN
INTRAVENOUS | Status: DC | PRN
  Administered 2019-09-22: 200 mg via INTRAVENOUS

## 2019-09-22 MED ORDER — PROPOFOL 10 MG/ML IV BOLUS
INTRAVENOUS | Status: AC
  Filled 2019-09-22: qty 20

## 2019-09-22 MED ORDER — ACETAMINOPHEN 650 MG RE SUPP: 650.0000 mg | Freq: Four times a day (QID) | RECTAL | Status: DC | PRN

## 2019-09-22 MED ORDER — ROCURONIUM BROMIDE 50 MG/5ML IV SOLN
INTRAVENOUS | Status: AC
  Filled 2019-09-22: qty 1

## 2019-09-22 MED ORDER — PROMETHAZINE HCL 25 MG/ML IJ SOLN: 6.2500 mg | INTRAMUSCULAR | Status: DC | PRN

## 2019-09-22 MED ORDER — DEXAMETHASONE SODIUM PHOSPHATE 10 MG/ML IJ SOLN
INTRAMUSCULAR | Status: AC
  Filled 2019-09-22: qty 1

## 2019-09-22 MED ORDER — LIDOCAINE HCL (CARDIAC) PF 100 MG/5ML IV SOSY
PREFILLED_SYRINGE | INTRAVENOUS | Status: DC | PRN
  Administered 2019-09-22: 100 mg via INTRAVENOUS

## 2019-09-22 MED ORDER — ROCURONIUM BROMIDE 100 MG/10ML IV SOLN
INTRAVENOUS | Status: DC | PRN
  Administered 2019-09-22: 50 mg via INTRAVENOUS

## 2019-09-22 MED ORDER — FENTANYL CITRATE (PF) 100 MCG/2ML IJ SOLN
INTRAMUSCULAR | Status: DC | PRN
  Administered 2019-09-22: 50 ug via INTRAVENOUS
  Administered 2019-09-22: 100 ug via INTRAVENOUS
  Administered 2019-09-22: 50 ug via INTRAVENOUS

## 2019-09-22 MED ORDER — FENTANYL CITRATE (PF) 100 MCG/2ML IJ SOLN
INTRAMUSCULAR | Status: AC
  Filled 2019-09-22: qty 4

## 2019-09-22 MED ORDER — SODIUM CHLORIDE 0.9 % IV SOLN: INTRAVENOUS | Status: DC

## 2019-09-22 MED ORDER — HYDROMORPHONE HCL 1 MG/ML IJ SOLN
INTRAMUSCULAR | Status: AC
  Filled 2019-09-22: qty 1

## 2019-09-22 MED ORDER — BUPIVACAINE-EPINEPHRINE (PF) 0.5% -1:200000 IJ SOLN
INTRAMUSCULAR | Status: DC | PRN
  Administered 2019-09-22: 10 mL
  Administered 2019-09-22: 20 mL

## 2019-09-22 MED ORDER — SODIUM CHLORIDE 0.9 % IV BOLUS
1000.0000 mL | Freq: Once | INTRAVENOUS | Status: AC
  Administered 2019-09-22: 1000 mL via INTRAVENOUS

## 2019-09-22 MED ORDER — PANTOPRAZOLE SODIUM 40 MG IV SOLR
40.0000 mg | Freq: Every day | INTRAVENOUS | Status: DC
  Administered 2019-09-22: 40 mg via INTRAVENOUS
  Filled 2019-09-22: qty 40

## 2019-09-22 MED ORDER — ACETAMINOPHEN 325 MG PO TABS
650.0000 mg | ORAL_TABLET | Freq: Four times a day (QID) | ORAL | Status: DC | PRN
  Administered 2019-09-22 – 2019-09-23 (×3): 650 mg via ORAL
  Filled 2019-09-22 (×3): qty 2

## 2019-09-22 MED ORDER — SUGAMMADEX SODIUM 200 MG/2ML IV SOLN
INTRAVENOUS | Status: AC
  Filled 2019-09-22: qty 2

## 2019-09-22 MED ORDER — ONDANSETRON 4 MG PO TBDP: 4.0000 mg | ORAL_TABLET | Freq: Four times a day (QID) | ORAL | Status: DC | PRN

## 2019-09-22 MED ORDER — PHENYLEPHRINE HCL (PRESSORS) 10 MG/ML IV SOLN
INTRAVENOUS | Status: DC | PRN
  Administered 2019-09-22: 100 ug via INTRAVENOUS
  Administered 2019-09-22: 200 ug via INTRAVENOUS
  Administered 2019-09-22: 100 ug via INTRAVENOUS

## 2019-09-22 SURGICAL SUPPLY — 37 items
APPLIER CLIP 5 13 M/L LIGAMAX5 (MISCELLANEOUS) ×2
BLADE SURG SZ11 CARB STEEL (BLADE) ×2 IMPLANT
CANISTER SUCT 1200ML W/VALVE (MISCELLANEOUS) ×2 IMPLANT
CATH CHOLANG 76X19 KUMAR (CATHETERS) IMPLANT
CHLORAPREP W/TINT 26 (MISCELLANEOUS) ×2 IMPLANT
CLIP APPLIE 5 13 M/L LIGAMAX5 (MISCELLANEOUS) ×1 IMPLANT
COVER WAND RF STERILE (DRAPES) ×2 IMPLANT
DERMABOND ADVANCED (GAUZE/BANDAGES/DRESSINGS) ×1
DERMABOND ADVANCED .7 DNX12 (GAUZE/BANDAGES/DRESSINGS) ×1 IMPLANT
ELECT REM PT RETURN 9FT ADLT (ELECTROSURGICAL) ×2
ELECTRODE REM PT RTRN 9FT ADLT (ELECTROSURGICAL) ×1 IMPLANT
GLOVE BIO SURGEON STRL SZ 6.5 (GLOVE) ×6 IMPLANT
GLOVE BIOGEL PI IND STRL 6.5 (GLOVE) ×1 IMPLANT
GLOVE BIOGEL PI INDICATOR 6.5 (GLOVE) ×1
GOWN STRL REUS W/ TWL LRG LVL3 (GOWN DISPOSABLE) ×2 IMPLANT
GOWN STRL REUS W/TWL LRG LVL3 (GOWN DISPOSABLE) ×2
GRASPER SUT TROCAR 14GX15 (MISCELLANEOUS) IMPLANT
HEMOSTAT SURGICEL 2X3 (HEMOSTASIS) IMPLANT
IRRIGATION STRYKERFLOW (MISCELLANEOUS) ×1 IMPLANT
IRRIGATOR STRYKERFLOW (MISCELLANEOUS) ×2
IV NS 1000ML (IV SOLUTION) ×1
IV NS 1000ML BAXH (IV SOLUTION) ×1 IMPLANT
KIT TURNOVER KIT A (KITS) ×2 IMPLANT
LABEL OR SOLS (LABEL) ×2 IMPLANT
NEEDLE HYPO 25X1 1.5 SAFETY (NEEDLE) ×2 IMPLANT
NEEDLE INSUFFLATION 14GA 120MM (NEEDLE) ×2 IMPLANT
NS IRRIG 500ML POUR BTL (IV SOLUTION) ×2 IMPLANT
PACK LAP CHOLECYSTECTOMY (MISCELLANEOUS) ×2 IMPLANT
POUCH SPECIMEN RETRIEVAL 10MM (ENDOMECHANICALS) ×2 IMPLANT
SCISSORS METZENBAUM CVD 33 (INSTRUMENTS) ×2 IMPLANT
SET TUBE SMOKE EVAC HIGH FLOW (TUBING) ×2 IMPLANT
SLEEVE ENDOPATH XCEL 5M (ENDOMECHANICALS) ×4 IMPLANT
SUT MNCRL AB 4-0 PS2 18 (SUTURE) ×2 IMPLANT
SUT VIC AB 0 CT1 36 (SUTURE) IMPLANT
SUT VICRYL 0 AB UR-6 (SUTURE) ×2 IMPLANT
TROCAR XCEL NON-BLD 11X100MML (ENDOMECHANICALS) ×2 IMPLANT
TROCAR XCEL NON-BLD 5MMX100MML (ENDOMECHANICALS) ×2 IMPLANT

## 2019-09-22 NOTE — ED Notes (Addendum)
Pt changed in to hospital gown and socks and assisted to bathroom. Pt provided remote and additional blanket per request. Pt education on call bell reinforced.

## 2019-09-22 NOTE — Anesthesia Postprocedure Evaluation (Signed)
Anesthesia Post Note  Patient: Raven Simpson  Procedure(s) Performed: LAPAROSCOPIC CHOLECYSTECTOMY (N/A )  Patient location during evaluation: PACU Anesthesia Type: General Level of consciousness: awake and alert Pain management: pain level controlled Vital Signs Assessment: post-procedure vital signs reviewed and stable Respiratory status: spontaneous breathing, nonlabored ventilation, respiratory function stable and patient connected to nasal cannula oxygen Cardiovascular status: blood pressure returned to baseline and stable Postop Assessment: no apparent nausea or vomiting Anesthetic complications: no     Last Vitals:  Vitals:   09/22/19 2005 09/22/19 2218  BP: 117/76 114/75  Pulse: (!) 103 100  Resp: 16 17  Temp: 36.9 C (!) 36.4 C  SpO2: 98% 98%    Last Pain:  Vitals:   09/22/19 2218  TempSrc: Oral  PainSc:                  Martha Clan

## 2019-09-22 NOTE — ED Notes (Signed)
US at bedside

## 2019-09-22 NOTE — ED Notes (Signed)
Report given to [place], Therapist, sports.

## 2019-09-22 NOTE — Anesthesia Preprocedure Evaluation (Signed)
Anesthesia Evaluation  Patient identified by MRN, date of birth, ID band Patient awake    Reviewed: Allergy & Precautions, H&P , NPO status , Patient's Chart, lab work & pertinent test results, reviewed documented beta blocker date and time   History of Anesthesia Complications Negative for: history of anesthetic complications  Airway Mallampati: III  TM Distance: >3 FB Neck ROM: full    Dental  (+) Dental Advidsory Given, Teeth Intact   Pulmonary Current Smoker,    Pulmonary exam normal        Cardiovascular Exercise Tolerance: Good negative cardio ROS Normal cardiovascular exam     Neuro/Psych negative neurological ROS  negative psych ROS   GI/Hepatic Neg liver ROS, GERD  ,  Endo/Other  negative endocrine ROS  Renal/GU negative Renal ROS  negative genitourinary   Musculoskeletal   Abdominal   Peds  Hematology negative hematology ROS (+)   Anesthesia Other Findings History reviewed. No pertinent past medical history.   Reproductive/Obstetrics negative OB ROS                             Anesthesia Physical Anesthesia Plan  ASA: II  Anesthesia Plan: General   Post-op Pain Management:    Induction: Intravenous  PONV Risk Score and Plan: 3 and Ondansetron, Dexamethasone, Midazolam, Promethazine and Treatment may vary due to age or medical condition  Airway Management Planned: Oral ETT  Additional Equipment:   Intra-op Plan:   Post-operative Plan: Extubation in OR  Informed Consent: I have reviewed the patients History and Physical, chart, labs and discussed the procedure including the risks, benefits and alternatives for the proposed anesthesia with the patient or authorized representative who has indicated his/her understanding and acceptance.     Dental Advisory Given  Plan Discussed with: Anesthesiologist, CRNA and Surgeon  Anesthesia Plan Comments:          Anesthesia Quick Evaluation

## 2019-09-22 NOTE — ED Notes (Signed)
Surgeon at bedside.  

## 2019-09-22 NOTE — ED Notes (Signed)
Pt c/o upper central abd pain since 11p last night. States N&V. C/o back pain as well. Denies fever. Denies urinary symptoms. Still has abd organs. A&O. States she is unable to get comfortable.

## 2019-09-22 NOTE — Anesthesia Post-op Follow-up Note (Signed)
Anesthesia QCDR form completed.        

## 2019-09-22 NOTE — Progress Notes (Signed)
Patient calm, resting with eyes closed, denies any pain.  States she does have anxiety at times and thinks this may be the cause.  Will continue with fluid bolus and continue to monitor vitals.

## 2019-09-22 NOTE — Transfer of Care (Signed)
Immediate Anesthesia Transfer of Care Note  Patient: [facility]  Procedure(s) Performed: LAPAROSCOPIC CHOLECYSTECTOMY (N/A )  Patient Location: PACU  Anesthesia Type:General  Level of Consciousness: awake, alert  and oriented  Airway & Oxygen Therapy: Patient Spontanous Breathing  Post-op Assessment: Report given to RN and Post -op Vital signs reviewed and stable  Post vital signs: Reviewed and stable  Last Vitals:  Vitals Value Taken Time  BP    Temp    Pulse    Resp    SpO2      Last Pain:  Vitals:   09/22/19 1505  TempSrc:   PainSc: 5       Patients Stated Pain Goal: 1 (08/67/61 9509)  Complications: No apparent anesthesia complications

## 2019-09-22 NOTE — Anesthesia Procedure Notes (Signed)
Procedure Name: Intubation Date/Time: 09/22/2019 4:02 PM Performed by: Chanetta Marshall, CRNA Pre-anesthesia Checklist: Patient identified, Emergency Drugs available, Suction available and Patient being monitored Patient Re-evaluated:Patient Re-evaluated prior to induction Oxygen Delivery Method: Circle system utilized Preoxygenation: Pre-oxygenation with 100% oxygen Induction Type: IV induction Ventilation: Mask ventilation without difficulty Laryngoscope Size: McGraph and 3 Grade View: Grade I Tube type: Oral Number of attempts: 1 Airway Equipment and Method: Oral airway and Video-laryngoscopy Placement Confirmation: ETT inserted through vocal cords under direct vision,  positive ETCO2,  breath sounds checked- equal and bilateral and CO2 detector Secured at: 21 cm Tube secured with: Tape Dental Injury: Teeth and Oropharynx as per pre-operative assessment

## 2019-09-22 NOTE — Progress Notes (Signed)
Dr. Rosey Bath aware of elevated heart rate, rate between 134 and 137.  Give remainder of fluid in the bag and 500 ml more. Will continue to monitor.

## 2019-09-22 NOTE — H&P (Signed)
SURGICAL CONSULTATION NOTE   HISTORY OF PRESENT ILLNESS (HPI):  28 y.o. female presented to [facility] for evaluation of abdominal pain since yesterday night. Patient reports having abdominal pain in the epigastric area that radiates to the right upper quadrant.  There is no alleviating factor.  She reports that she has had these pain in the past aggravated by food intake.  Denies fever chills.  Pain has been associated with nausea or vomiting.  Surgery is consulted by Dr. Archie Balboa in this context for evaluation and management of cholecystitis.  PAST MEDICAL HISTORY (PMH):  Gastritis Cholelithiasis  PAST SURGICAL HISTORY ([facility]):  Patient has no previous surgeries  MEDICATIONS:  Prior to Admission medications   Medication Sig Start Date End Date Taking? Authorizing Provider  famotidine (PEPCID) 40 MG tablet Take 1 tablet (40 mg total) by mouth every evening. 12/24/17 12/24/18  Schaevitz, Randall An, MD  prenatal vitamin w/FE, FA (PRENATAL 1 + 1) 27-1 MG TABS tablet Take 1 tablet by mouth daily at 12 noon.    [provider]     ALLERGIES:  No Known Allergies   SOCIAL HISTORY:  Social History   Socioeconomic History  . Marital status: Single    Spouse name: Not on file  . Number of children: Not on file  . Years of education: Not on file  . Highest education level: Not on file  Occupational History  . Not on file  Tobacco Use  . Smoking status: Former Research scientist (life sciences)  . Smokeless tobacco: Never Used  Substance and Sexual Activity  . Alcohol use: Not Currently  . Drug use: Not on file  . Sexual activity: Not on file  Other Topics Concern  . Not on file  Social History Narrative  . Not on file   Social Determinants of Health   Financial Resource Strain:   . Difficulty of Paying Living Expenses: Not on file  Food Insecurity:   . Worried About Charity fundraiser in the Last Year: Not on file  . Ran Out of Food in the Last Year: Not on file  Transportation Needs:   .  Lack of Transportation (Medical): Not on file  . Lack of Transportation (Non-Medical): Not on file  Physical Activity:   . Days of Exercise per Week: Not on file  . Minutes of Exercise per Session: Not on file  Stress:   . Feeling of Stress : Not on file  Social Connections:   . Frequency of Communication with Friends and Family: Not on file  . Frequency of Social Gatherings with Friends and Family: Not on file  . Attends Religious Services: Not on file  . Active Member of Clubs or Organizations: Not on file  . Attends Archivist Meetings: Not on file  . Marital Status: Not on file  Intimate Partner Violence:   . Fear of Current or Ex-Partner: Not on file  . Emotionally Abused: Not on file  . Physically Abused: Not on file  . Sexually Abused: Not on file    The patient currently resides (home / rehab facility / nursing home): Home The patient normally is (ambulatory / bedbound): Ambulatory   FAMILY HISTORY:  No family history on file.   REVIEW OF SYSTEMS:  Constitutional: denies weight loss, fever, chills, or sweats  Eyes: denies any other vision changes, history of eye injury  ENT: denies sore throat, hearing problems  Respiratory: denies shortness of breath, wheezing  Cardiovascular: denies chest pain, palpitations  Gastrointestinal: positive abdominal  pain, positive nausea and vomitnig Genitourinary: denies burning with urination or urinary frequency Musculoskeletal: denies any other joint pains or cramps  Skin: denies any other rashes or skin discolorations  Neurological: denies any other headache, dizziness, weakness  Psychiatric: denies any other depression, anxiety   All other review of systems were negative   VITAL SIGNS:  Temp:  [98.7 F (37.1 C)] 98.7 F (37.1 C) (12/26 0526) Pulse Rate:  [87-98] 87 (12/26 1000) Resp:  [18] 18 (12/26 0911) BP: (111-137)/(83-88) 111/85 (12/26 1000) SpO2:  [97 %-100 %] 97 % (12/26 1000) Weight:  [90.7 kg] 90.7 kg  (12/26 0526)     Height: 5\' 1"  (154.9 cm) Weight: 90.7 kg BMI (Calculated): 37.81   PHYSICAL EXAM:  Constitutional:  -- Normal body habitus  -- Awake, alert, and oriented x3  Eyes:  -- Pupils equally round and reactive to light  -- No scleral icterus  Ear, nose, and throat:  -- No jugular venous distension  Pulmonary:  -- No crackles  -- Equal breath sounds bilaterally -- Breathing non-labored at rest Cardiovascular:  -- S1, S2 present  -- No pericardial rubs Gastrointestinal:  -- Abdomen soft, moderate tender in right upper quadrant, non-distended, no guarding or rebound tenderness -- No abdominal masses appreciated, pulsatile or otherwise  Musculoskeletal and Integumentary:  -- Wounds or skin discoloration: None appreciated -- Extremities: B/L UE and LE FROM, hands and feet warm, no edema  Neurologic:  -- Motor function: intact and symmetric -- Sensation: intact and symmetric   Labs:  CBC Latest Ref Rng & Units 09/22/2019 09/04/2018 12/24/2017  WBC 4.0 - 10.5 K/uL 11.4(H) 9.5 9.1  Hemoglobin 12.0 - 15.0 g/dL 12/26/2017 15.5(H) 14.8  Hematocrit 36.0 - 46.0 % 42.2 45.6 43.3  Platelets 150 - 400 K/uL 251 266 268   CMP Latest Ref Rng & Units 09/22/2019 09/04/2018 12/24/2017  Glucose 70 - 99 mg/dL 12/26/2017) 314(H) 702(O)  BUN 6 - 20 mg/dL 20 10 17   Creatinine 0.44 - 1.00 mg/dL 378(H ) 8.85)  Sodium 135 - 145 mmol/L 139 136 137  Potassium 3.5 - 5.1 mmol/L 3.6 3.5 3.8  Chloride 98 - 111 mmol/L 105 106 106  CO2 22 - 32 mmol/L 22 23 22   Calcium 8.9 - 10.3 mg/dL 9.1 9.1 9.0  Total Protein 6.5 - 8.1 g/dL 7.5 - 7.5  Total Bilirubin 0.3 - 1.2 mg/dL 0.4 - 0.5  Alkaline Phos 38 - 126 U/L 57 - 53  AST 15 - 41 U/L 23 - 24  ALT 0 - 44 U/L 49(H) - 39     Imaging studies:  I personally evaluated the ultrasound showing stones in the gallbladder.  I also evaluated the labs showing vital cell count of 11,000.  Normal AST, ALT, alkaline phosphatase and bilirubin.  Assessment/Plan: 28  y.o. female with acute cholecystitis, complicated by pertinent comorbidities including gastritis.  Patient with history, physical exam and images consistent with acute cholecystitis. Patient oriented about diagnosis and surgical management as treatment.   Discussed the risk of surgery including post-op infxn, seroma, biloma, chronic pain, poor-delayed wound healing, retained gallstone, conversion to open procedure, post-op SBO or ileus, and need for additional procedures to address said risks.  The risks of general anesthetic including MI, CVA, sudden death or even reaction to anesthetic medications also discussed. Alternatives include continued observation.  Benefits include possible symptom relief, prevention of complications including acute cholecystitis, pancreatitis.  4.12(I, MD

## 2019-09-22 NOTE — Op Note (Signed)
Preoperative diagnosis: Acute  cholecystitis.  Postoperative diagnosis: Acute  cholecystitis.  Procedure: Laparoscopic Cholecystectomy.   Anesthesia: GETA   Surgeon: Dr. Windell Moment  Wound Classification: Clean Contaminated  Indications: Patient is a 28 y.o. female developed right upper quadrant pain, nausea, vomiting, leukocytosis and on workup was found to have cholelithiasis with a normal common duct and positive Murphy's sign suggesting cholecystitis. Laparoscopic cholecystectomy was elected.  Findings: Severe edema and gallbladder wall thickening Critical view of safety achieved Cystic duct and artery identified, ligated and divided Incisional hernia from previous c-section identified.  Adequate hemostasis  Description of procedure: The patient was placed on the operating table in the supine position. General anesthesia was induced. A time-out was completed verifying correct patient, procedure, site, positioning, and implant(s) and/or special equipment prior to beginning this procedure. An orogastric tube was placed. The abdomen was prepped and draped in the usual sterile fashion.  An incision was made in a natural skin line above the umbilicus.  The fascia was elevated and the Veress needle inserted. Proper position was confirmed by aspiration and saline meniscus test.  The abdomen was insufflated with carbon dioxide to a pressure of 15 mmHg. The patient tolerated insufflation well. A 11-mm trocar was then inserted.  The laparoscope was inserted and the abdomen inspected. No injuries from initial trocar placement were noted. Additional trocars were then inserted in the following locations: a 5-mm trocar in the right epigastrium and two 5-mm trocars along the right costal margin. The abdomen was inspected and no abnormalities were found. The table was placed in the reverse Trendelenburg position with the right side up.  Filmy adhesions between the gallbladder and omentum, duodenum and  transverse colon were lysed sharply. The gallbladder wall was so edematous and thick that it needed to be aspirated to be able to be grasped. The dome of the gallbladder was grasped with an atraumatic grasper passed through the lateral port and retracted over the dome of the liver. The infundibulum was also grasped with an atraumatic grasper through the midclavicular port and retracted toward the right lower quadrant. This maneuver exposed Calot's triangle. The peritoneum overlying the gallbladder infundibulum was then incised and the cystic duct and cystic artery identified and circumferentially dissected. Critical view of safety reviewed before ligating any structure. The cystic duct and cystic artery were then doubly clipped and divided close to the gallbladder.  The gallbladder was then dissected from its peritoneal attachments by electrocautery. Hemostasis was checked and the gallbladder and contained stones were removed using an endoscopic retrieval bag placed through the umbilical port. The gallbladder was passed off the table as a specimen. The gallbladder fossa was copiously irrigated with saline and hemostasis was obtained. There was no evidence of bleeding from the gallbladder fossa or cystic artery or leakage of the bile from the cystic duct stump. Secondary trocars were removed under direct vision. No bleeding was noted. The laparoscope was withdrawn and the umbilical trocar removed. The abdomen was allowed to collapse. The fascia of the 56mm trocar sites was closed with figure-of-eight 0 vicryl sutures. The skin was closed with subcuticular sutures of 4-0 monocryl and topical skin adhesive. The orogastric tube was removed.  The patient tolerated the procedure well and was taken to the postanesthesia care unit in stable condition.   Specimen: Gallbladder  Complications: None  EBL: 10 mL

## 2019-09-22 NOTE — ED Triage Notes (Signed)
Pt ambulatory to triage with no difficulty. Pt reports she has pain that starts in the middle of her upper middle of her abd and radiates around both sides of her abd. Pt reports has  had the same type of pain in the past and told she had reflux but this pain is worse. Pt reports vomited x 2. Denies diarrhea.

## 2019-09-22 NOTE — ED Provider Notes (Signed)
[facility] Emergency Department Provider Note  ____________________________________________   I have reviewed the triage vital signs and the nursing notes.   HISTORY  Chief Complaint Abdominal Pain   History limited by: Not Limited   HPI [facility] is a 28 y.o. female who presents to the emergency department today because of concern for abdominal pain. Started last night around 11p. Located in the epigastric region it does radiate to her back. It is severe. It is associated with nausea and vomiting. She states she has had similar episodes of pain in the past and was told she had acid reflux. She does take antacids. She does eat spicy food. Denies history of abdominal surgery. No change in her stooling, states that she typically does have diarrhea.    Records reviewed. Per medical record review patient has a history of ED visit in the past with diagnosis of gastritis.   No past medical history on file.  Patient Active Problem List   Diagnosis Date Noted  . Pregnancy 03/12/2017  . Abdominal pain affecting pregnancy 03/12/2017    No past surgical history on file.  Prior to Admission medications   Medication Sig Start Date End Date Taking? Authorizing Provider  famotidine (PEPCID) 40 MG tablet Take 1 tablet (40 mg total) by mouth every evening. 12/24/17 12/24/18  Schaevitz, Myra Rude, MD  prenatal vitamin w/FE, FA (PRENATAL 1 + 1) 27-1 MG TABS tablet Take 1 tablet by mouth daily at 12 noon.    [provider]    Allergies Patient has no known allergies.  No family history on file.  Social History Social History   Tobacco Use  . Smoking status: Former Games developer  . Smokeless tobacco: Never Used  Substance Use Topics  . Alcohol use: Not Currently  . Drug use: Not on file    Review of Systems Constitutional: No fever/chills Eyes: No visual changes. ENT: No sore throat. Cardiovascular: Denies chest pain. Respiratory: Denies shortness  of breath. Gastrointestinal: Positive for abdominal pain, nausea and vomiting. Positive for chronic diarrhea.  Genitourinary: Negative for dysuria. Musculoskeletal: Negative for back pain. Skin: Negative for rash. Neurological: Negative for headaches, focal weakness or numbness.  ____________________________________________   PHYSICAL EXAM:  VITAL SIGNS: ED Triage Vitals [09/22/19 0526]  Enc Vitals Group     BP 137/88     Pulse Rate 97     Resp 18     Temp 98.7 F (37.1 C)     Temp Source Oral     SpO2 98 %     Weight 200 lb (90.7 kg)     Height 5\' 1"  (1.549 m)     Head Circumference      Peak Flow      Pain Score 10   Constitutional: Alert and oriented.  Eyes: Conjunctivae are normal.  ENT      Head: Normocephalic and atraumatic.      Nose: No congestion/rhinnorhea.      Mouth/Throat: Mucous membranes are moist.      Neck: No stridor. Hematological/Lymphatic/Immunilogical: No cervical lymphadenopathy. Cardiovascular: Normal rate, regular rhythm.  No murmurs, rubs, or gallops.  Respiratory: Normal respiratory effort without tachypnea nor retractions. Breath sounds are clear and equal bilaterally. No wheezes/rales/rhonchi. Gastrointestinal: Soft and tender to palpation in the upper abdomen, worse in the epigastric and RUQ.  Genitourinary: Deferred Musculoskeletal: Normal range of motion in all extremities. No lower extremity edema. Neurologic:  Normal speech and language. No gross focal neurologic deficits are appreciated.  Skin:  Skin is warm, dry and intact. No rash noted. Psychiatric: Mood and affect are normal. Speech and behavior are normal. Patient exhibits appropriate insight and judgment.  ____________________________________________    LABS (pertinent positives/negatives)  Upreg negative UA cloudy, 30 protein, small leukocytes, 21-50 WBC, rare bacteria, 21-50 squamous epi CBC wbc 11.4, hgb 15.0, plt 251 Lipase 27 CMP wnl except glu 141, ast  49  ____________________________________________   EKG  I, Nance Pear, attending physician, personally viewed and interpreted this EKG  EKG Time: 0531 Rate: 96 Rhythm: normal sinus rhythm Axis: normal Intervals: qtc 452 QRS: narrow ST changes: no st elevation Impression: normal ekg ____________________________________________    RADIOLOGY  [place] RUQ Concern for acute cholecystitis, thickened GBW, positive sonographic murphy's sign  ____________________________________________   PROCEDURES  Procedures  ____________________________________________   INITIAL IMPRESSION / ASSESSMENT AND PLAN / ED COURSE  Pertinent labs & imaging results that were available during my care of the patient were reviewed by me and considered in my medical decision making (see chart for details).   Patient presented to the emergency department today because of concerns for abdominal pain. On exam patient was tender in the upper abdomen and right upper quadrant. Very mild leukocytosis. Did obtain an ultrasound given concern for possible gallbladder disease. Ultrasound returned concerning for acute cholecystitis. I discussed with Dr. Peyton Najjar with surgery. Discussed finding with patient.   ___________________________________________   FINAL CLINICAL IMPRESSION(S) / ED DIAGNOSES  Final diagnoses:  Epigastric pain     Note: This dictation was prepared with Dragon dictation. Any transcriptional errors that result from this process are unintentional     Nance Pear, MD 09/22/19 1054

## 2019-09-23 NOTE — Progress Notes (Signed)
09/23/2019 10:22 AM  [facility] to be D/C'd Home per MD order.  Discussed prescriptions and follow up appointments with the patient. Prescriptions given to patient, medication list explained in detail. Pt verbalized understanding.  Allergies as of 09/23/2019   No Known Allergies     Medication List    TAKE these medications   famotidine 40 MG tablet Commonly known as: PEPCID Take 1 tablet (40 mg total) by mouth every evening. Notes to patient: Evening 09/23/19   norethindrone 0.35 MG tablet Commonly known as: MICRONOR Take 1 tablet by mouth daily. Notes to patient: Morning 09/24/19   prenatal vitamin w/FE, FA 27-1 MG Tabs tablet Take 1 tablet by mouth daily at 12 noon. Notes to patient: 1200 09/23/19       Vitals:   09/22/19 2218 09/23/19 0456  BP: 114/75 108/68  Pulse: 100 83  Resp: 17 16  Temp: (!) 97.5 F (36.4 C) 98.6 F (37 C)  SpO2: 98% 97%    Skin clean, dry and intact without evidence of skin break down, no evidence of skin tears noted. IV catheter discontinued intact. Site without signs and symptoms of complications. Dressing and pressure applied. Pt denies pain at this time. No complaints noted.  An After Visit Summary was printed and given to the patient. Patient escorted via [place], and D/C home via private auto.  Dola Argyle

## 2019-09-23 NOTE — Discharge Instructions (Signed)
  Diet: Resume home heart healthy regular diet.   Activity: No heavy lifting >20 pounds (children, pets, laundry, garbage) or strenuous activity for one week, but light activity and walking are encouraged. Do not drive or drink alcohol if taking narcotic pain medications.  Wound care: May shower with soapy water and pat dry (do not rub incisions), but no baths or submerging incision underwater until follow-up. (no swimming)   Medications: Resume all home medications. For mild to moderate pain: acetaminophen (Tylenol) or ibuprofen (if no kidney disease). Combining Tylenol with alcohol can substantially increase your risk of causing liver disease.  Call office [phone]) at any time if any questions, worsening pain, fevers/chills, bleeding, drainage from incision site, or other concerns.

## 2019-09-23 NOTE — Discharge Summary (Signed)
  Patient ID: Raven Simpson MRN: [id number] DOB/AGE: [birth year] 28 y.o.  Admit date: 09/22/2019 Discharge date: 09/23/2019   Discharge Diagnoses:  Active Problems:   Acute cholecystitis   Procedures: Laparoscopic cholecystectomy  Hospital Course: Patient with acute cholecystitis.  She was admitted for laparoscopic cholecystectomy.  She tolerated the procedure well.  This morning patient ate without pain controlled with Tylenol and Advil.  She tolerated full liquid diet.  She ambulated.  She is passing gas.  Wounds are dry and clean.  Physical Exam  Constitutional: She is oriented to person, place, and time and well-developed, well-nourished, and in no distress.  Cardiovascular: Normal rate.  Pulmonary/Chest: Effort normal.  Abdominal: Soft. She exhibits no distension. There is no abdominal tenderness. There is no rebound.  Neurological: She is alert and oriented to person, place, and time.  Skin: Skin is warm.     Consults: None  Disposition: Discharge disposition: 01-Home or Self Care       Discharge Instructions    Diet - low sodium heart healthy   Complete by: As directed    Increase activity slowly   Complete by: As directed      Allergies as of 09/23/2019   No Known Allergies     Medication List    TAKE these medications   famotidine 40 MG tablet Commonly known as: PEPCID Take 1 tablet (40 mg total) by mouth every evening.   norethindrone 0.35 MG tablet Commonly known as: MICRONOR Take 1 tablet by mouth daily.   prenatal vitamin w/FE, FA 27-1 MG Tabs tablet Take 1 tablet by mouth daily at 12 noon.      Follow-up Information    Herbert Pun, MD Follow up in 2 week(s).   Specialty: General Surgery Contact information: [address] [place] [address] [phone]

## 2019-09-25 LAB — SURGICAL PATHOLOGY

## 2020-02-24 IMAGING — US US ABDOMEN LIMITED
1 series · 13 of 25 positions shown · non-contrast
Comparison: None.

CLINICAL DATA: Epigastric pain.

EXAM:
ULTRASOUND ABDOMEN LIMITED RIGHT UPPER QUADRANT

[Series 1: us abdomen limited · 13 of 41 slices shown]
[im 1/41]
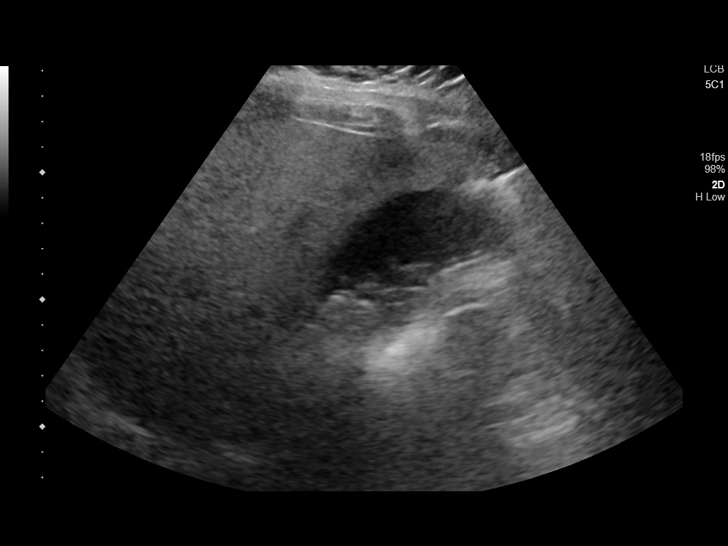
[im 4/41]
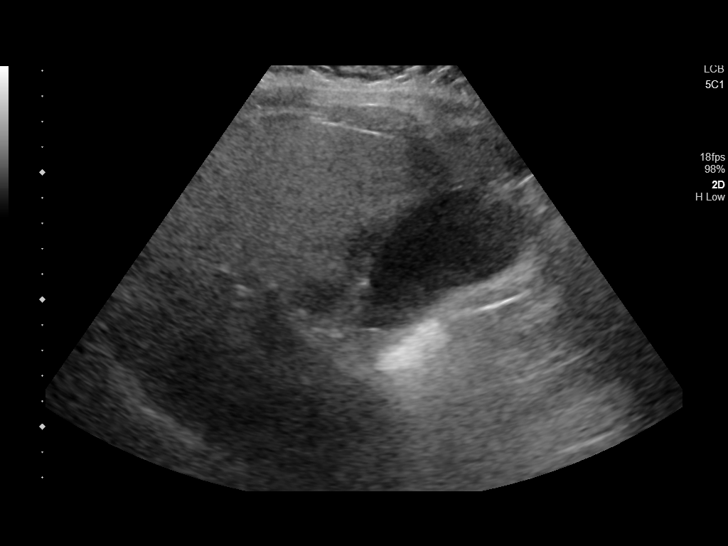
[im 7/41]
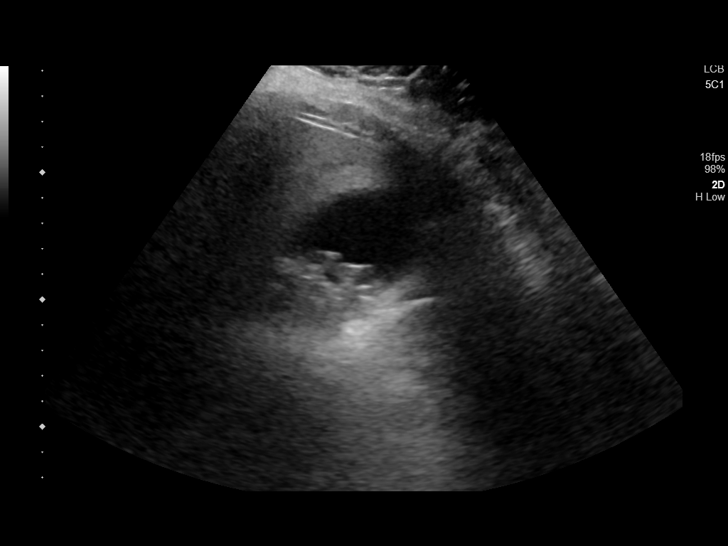
[im 11/41]
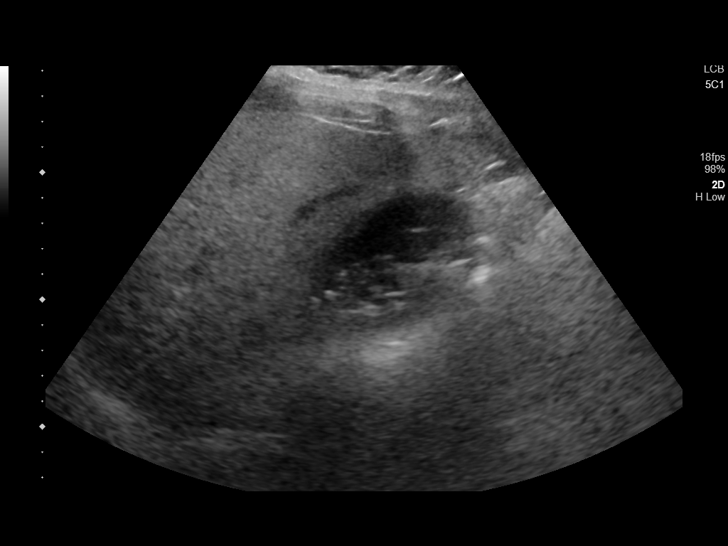
[im 14/41]
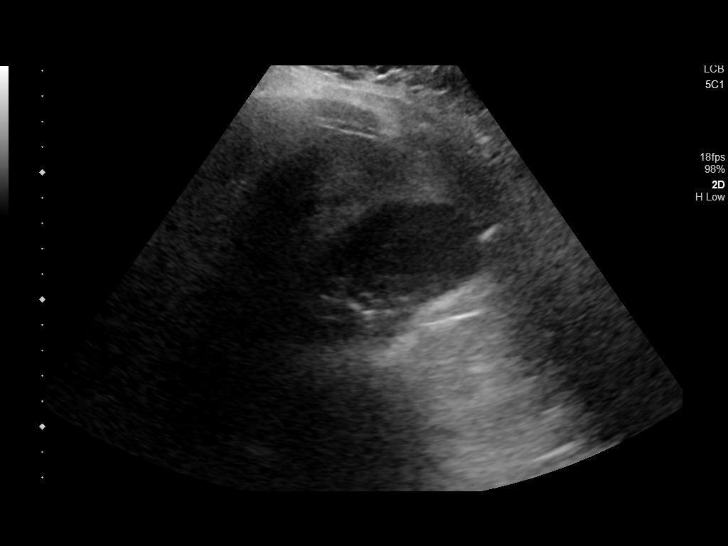
[im 17/41]
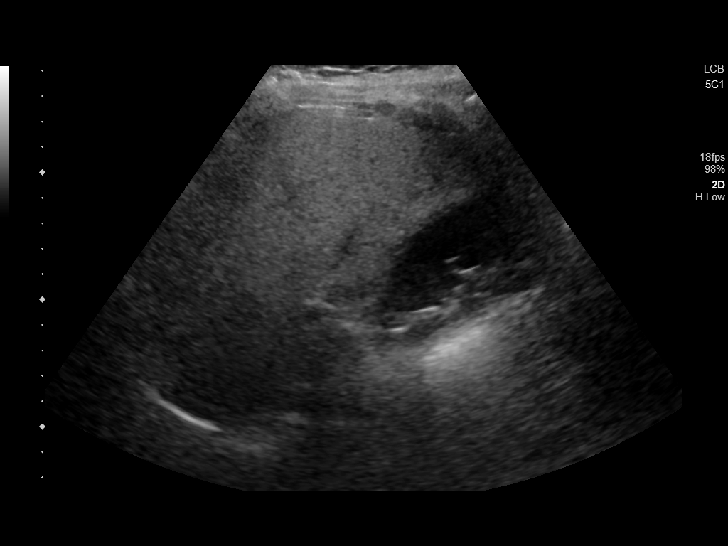
[im 21/41]
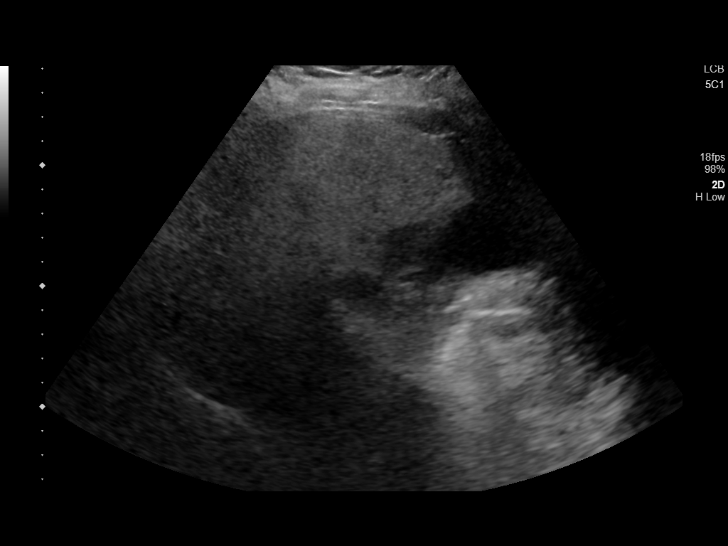
[im 24/41]
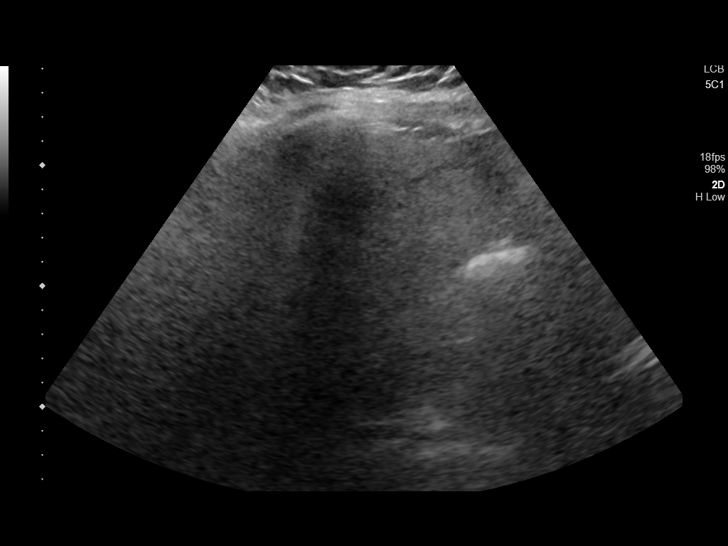
[im 27/41]
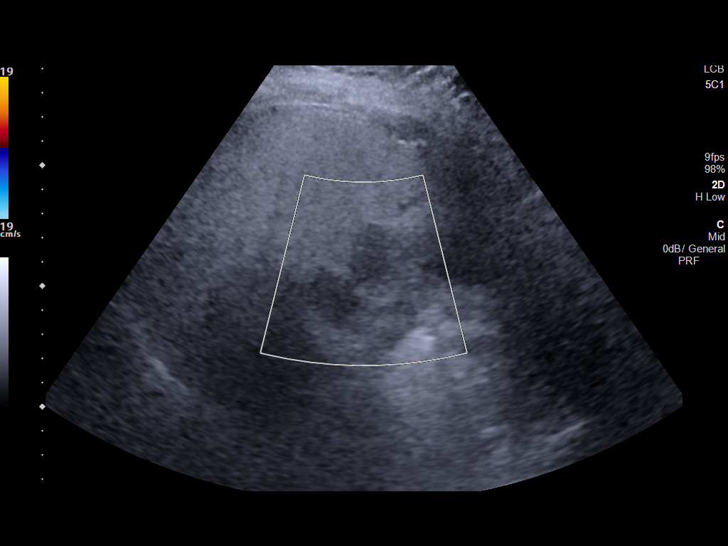
[im 31/41]
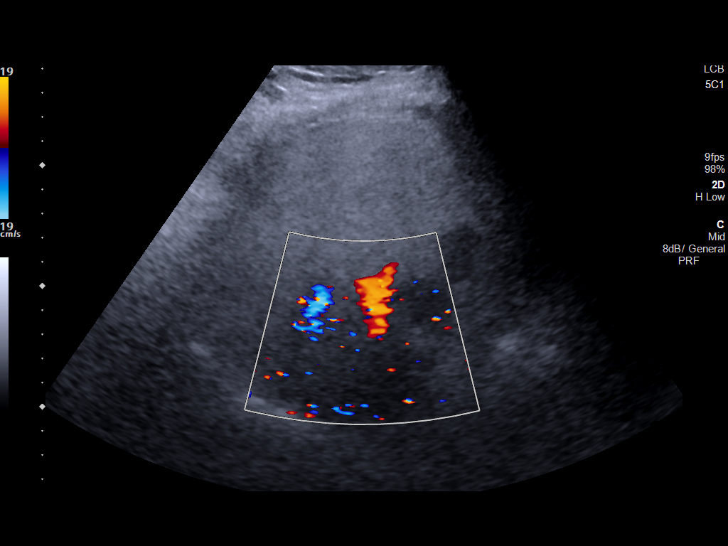
[im 34/41]
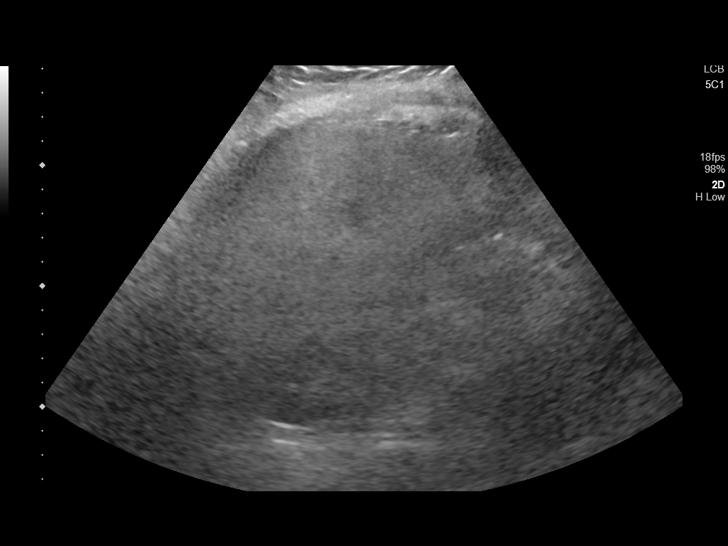
[im 37/41]
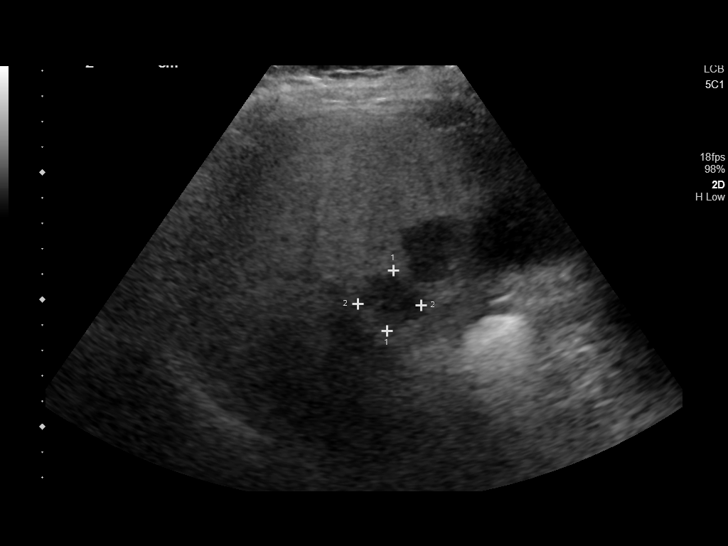
[im 41/41]
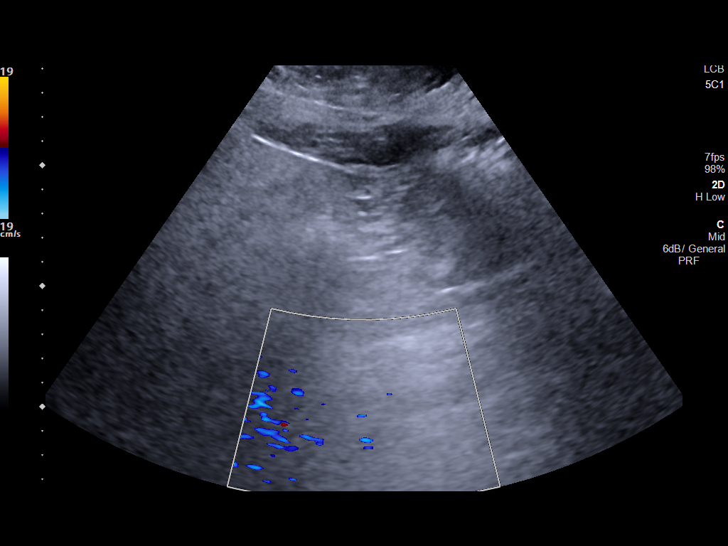

[13 of 25 positions shown; findings below may reference images not displayed]

FINDINGS: Gallbladder:

Numerous tiny gallstones noted in the gallbladder lumen. Gallbladder
wall thickness upper normal, approaching 3 mm. Sonographer reports a
positive sonographic Murphy sign. No appreciable pericholecystic
fluid.

Common bile duct:

Diameter: 4-5 mm.

Liver:

Diffusely increased echogenicity with poor through transmission,
ultrasound features suggesting diffuse fatty deposition. Subcapsular
areas measuring 2-3 cm in the central liver are hypoechoic relative
to the background hepatic parenchyma and likely reflect areas of
focal fatty sparing. Portal vein is patent on color Doppler imaging
with normal direction of blood flow towards the liver.

Other: None.
IMPRESSION: 1. Cholelithiasis with borderline gallbladder wall thickening and
positive sonographic Murphy sign. This constellation of findings is
concerning for acute cholecystitis. If clinical picture is
equivocal, nuclear scintigraphy may prove helpful to assess for
cystic duct obstruction.
2. No intra or extrahepatic biliary duct dilatation.
3. Liver parenchyma diffusely echogenic suggesting steatosis. Focal
subcapsular areas in the medial liver measuring 2-3 cm are
hypoechoic relative to background liver parenchyma and probably
represent areas of fatty sparing in this young individual. MRI of
the abdomen could be used to confirm and exclude underlying mass
lesion.

## 2020-05-08 ENCOUNTER — Other Ambulatory Visit: Payer: Self-pay | Admitting: Primary Care

## 2020-05-08 DIAGNOSIS — R102 Pelvic and perineal pain: Secondary | ICD-10-CM

## 2020-05-08 DIAGNOSIS — Z3201 Encounter for pregnancy test, result positive: Secondary | ICD-10-CM

## 2020-05-14 ENCOUNTER — Other Ambulatory Visit: Payer: Self-pay

## 2020-05-14 ENCOUNTER — Ambulatory Visit
Admission: RE | Admit: 2020-05-14 | Discharge: 2020-05-14 | Disposition: A | Discharge status: Home / Self Care | Payer: Medicaid Other | Source: Ambulatory Visit | Attending: Primary Care | Admitting: Primary Care

## 2020-05-14 DIAGNOSIS — Z3201 Encounter for pregnancy test, result positive: Secondary | ICD-10-CM | POA: Insufficient documentation

## 2020-05-14 DIAGNOSIS — R102 Pelvic and perineal pain: Secondary | ICD-10-CM | POA: Insufficient documentation

## 2020-10-16 IMAGING — US US OB < 14 WEEKS - US OB TV
1 series · 14 of 28 positions shown · non-contrast
Comparison: None.

CLINICAL DATA: Irregular menses.  Unsure of LMP.

EXAM:
OBSTETRIC <14 WK US AND TRANSVAGINAL OB US
TECHNIQUE: Both transabdominal and transvaginal ultrasound examinations were
performed for complete evaluation of the gestation as well as the
maternal uterus, adnexal regions, and pelvic cul-de-sac.
Transvaginal technique was performed to assess early pregnancy.

[Series 1: us ob less than 14 weeks with ob transvaginal · 14 of 121 slices shown]
[im 5/121]
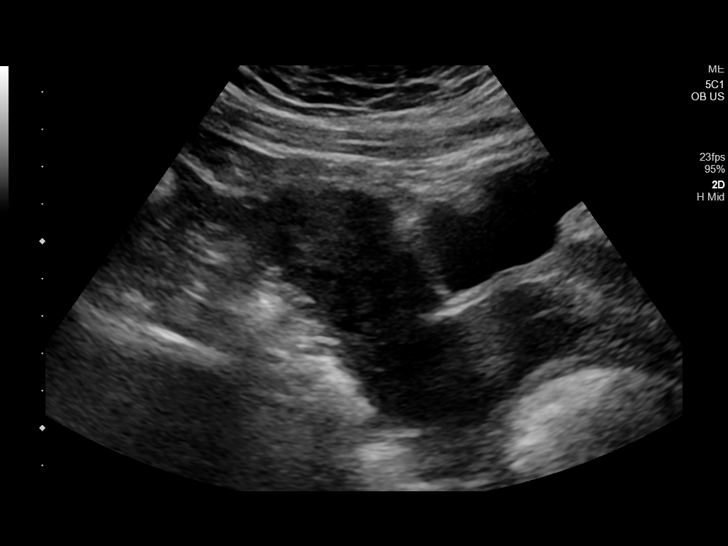
[im 14/121]
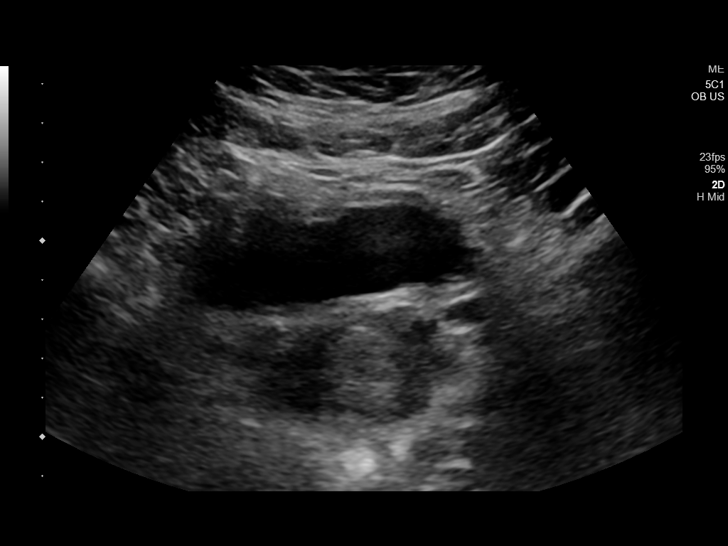
[im 23/121]
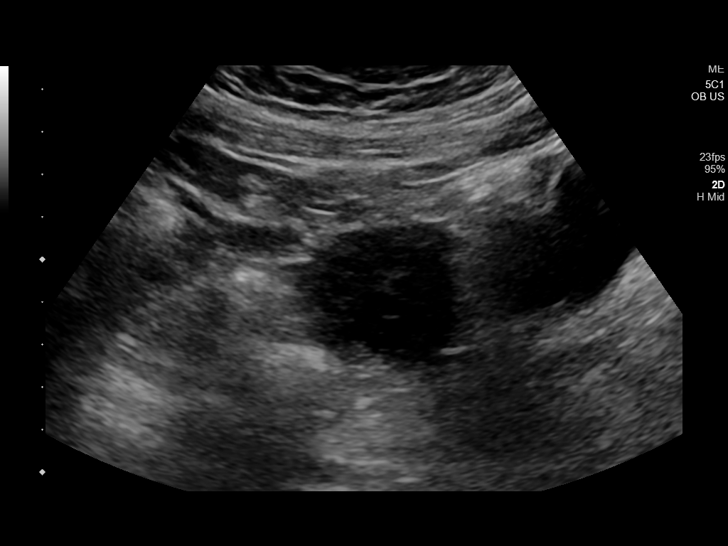
[im 32/121]
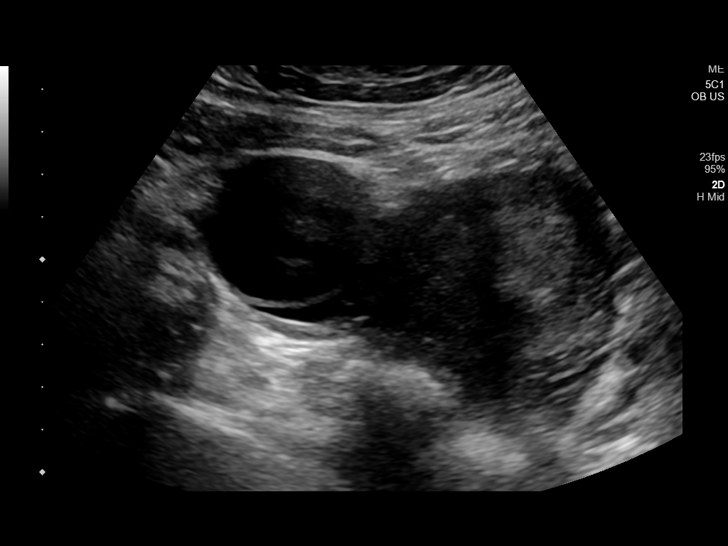
[im 41/121]
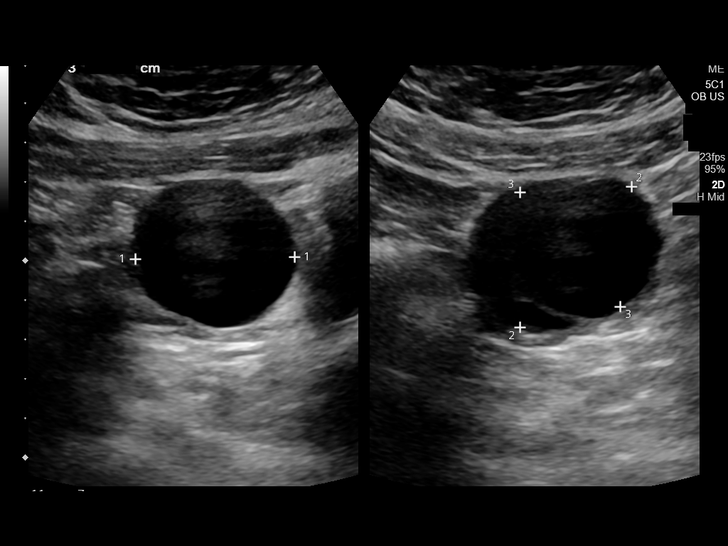
[im 49/121]
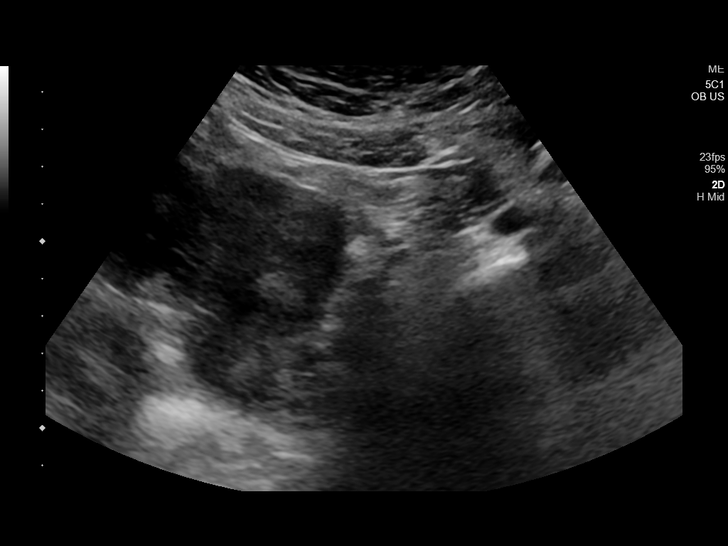
[im 58/121]
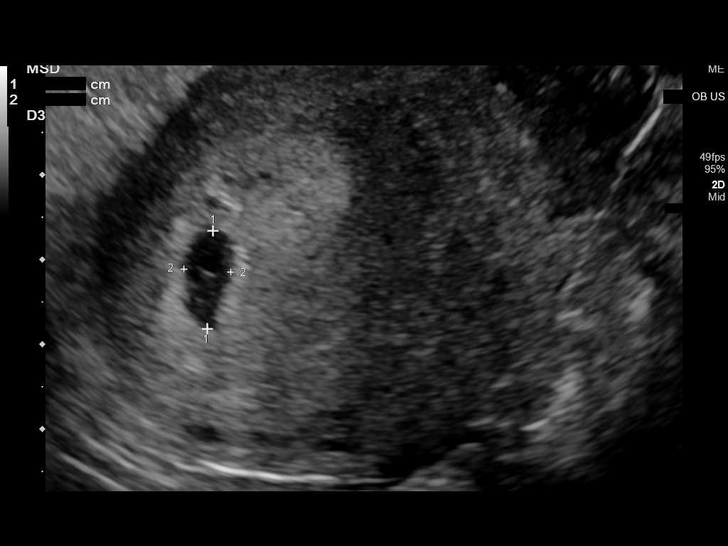
[im 67/121]
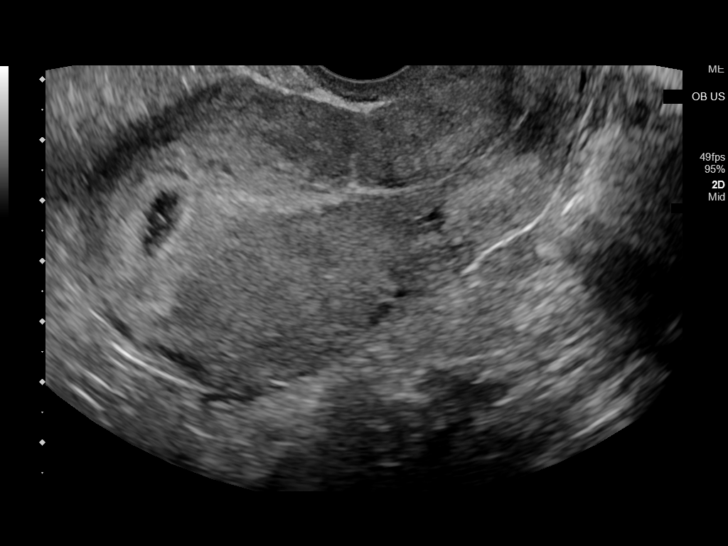
[im 76/121]
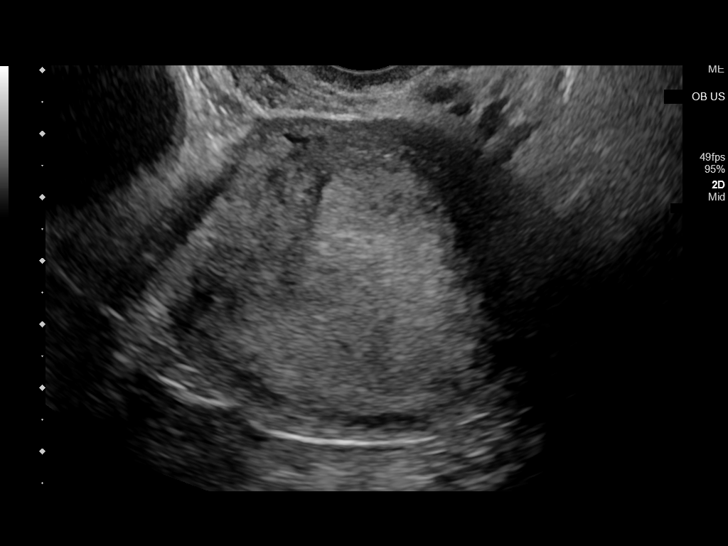
[im 85/121]
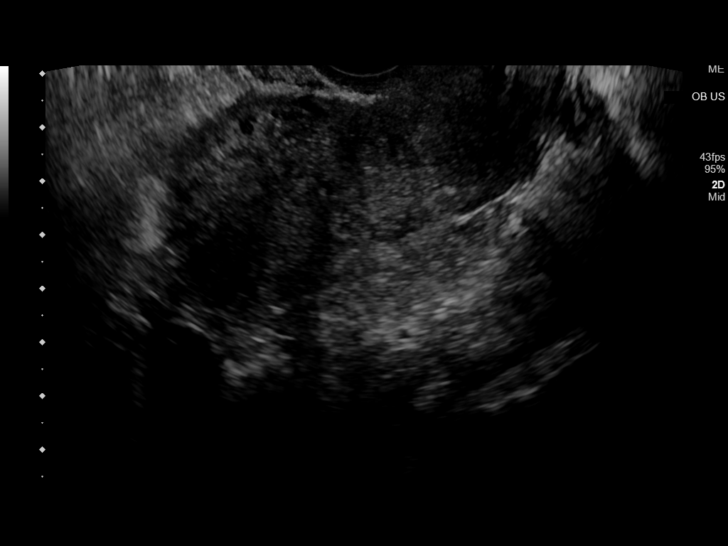
[im 94/121]
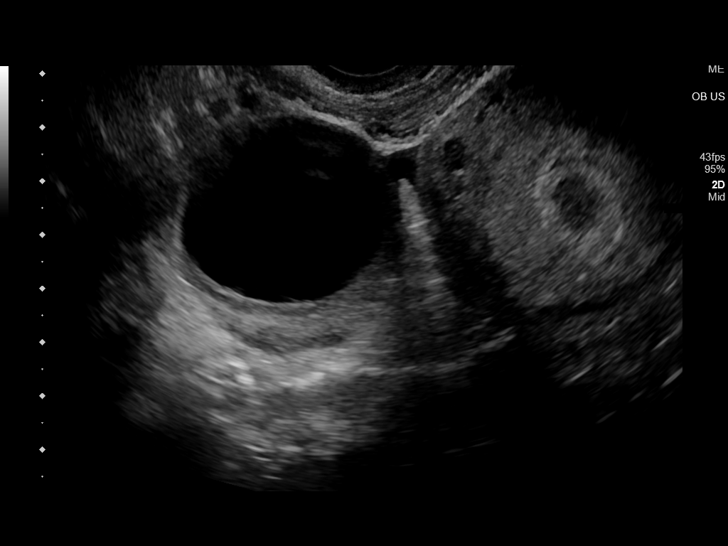
[im 103/121]
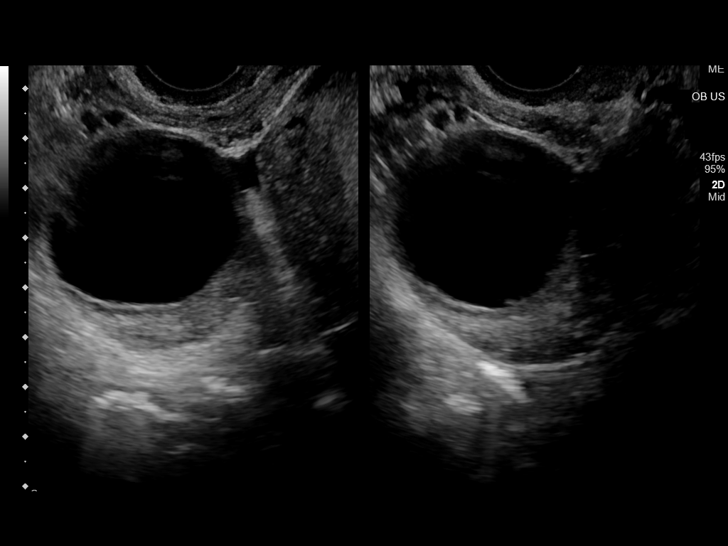
[im 112/121]
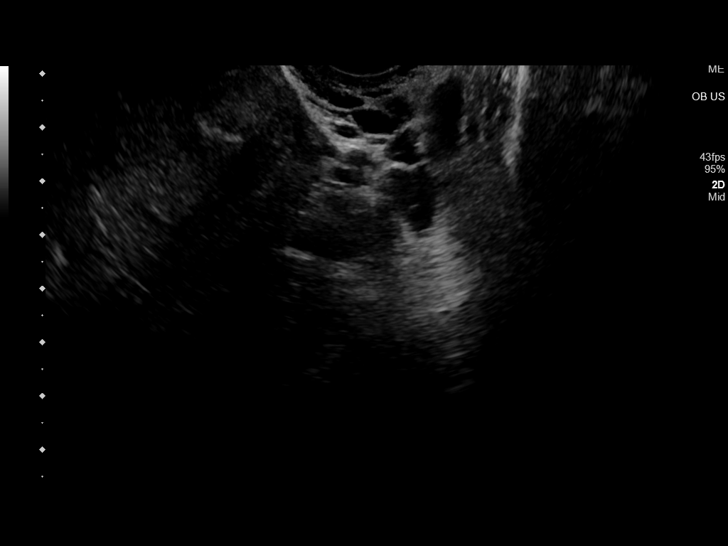
[im 121/121]
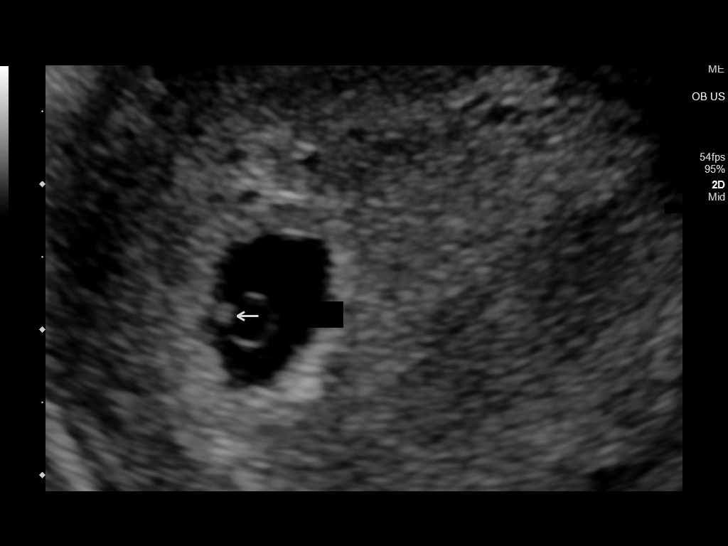

[14 of 28 positions shown; findings below may reference images not displayed]

FINDINGS: Intrauterine gestational sac: Single

Yolk sac:  Visualized.

Embryo:  Visualized.

Cardiac Activity: Visualized.

Heart Rate: 100 bpm

CRL:  4 mm   6 w   0 d                  US EDC: 01/07/2021

Subchorionic hemorrhage:  None visualized.

Maternal uterus/adnexae: Normal appearance of left ovary. A complex
right ovarian cyst with several thin internal septations is seen
which measures 5.1 x 3.8 by 4.1 cm. No evidence of free fluid.
IMPRESSION: Single living IUP with estimated gestational age of 6 weeks 0 days,
and US EDC of 01/07/2021.

5 cm indeterminate complex cystic lesion of right ovary. Recommend
continued follow-up by ultrasound in 6 weeks.

## 2021-01-01 DIAGNOSIS — O34219 Maternal care for unspecified type scar from previous cesarean delivery: Secondary | ICD-10-CM | POA: Diagnosis not present

## 2023-09-28 DIAGNOSIS — Z87891 Personal history of nicotine dependence: Secondary | ICD-10-CM | POA: Diagnosis not present

## 2023-09-28 DIAGNOSIS — E669 Obesity, unspecified: Secondary | ICD-10-CM | POA: Diagnosis not present

## 2023-09-28 DIAGNOSIS — R0982 Postnasal drip: Secondary | ICD-10-CM | POA: Diagnosis not present

## 2023-09-28 DIAGNOSIS — R000 Tachycardia, unspecified: Secondary | ICD-10-CM | POA: Diagnosis not present

## 2023-09-28 DIAGNOSIS — R220 Localized swelling, mass and lump, head: Secondary | ICD-10-CM | POA: Diagnosis not present

## 2024-02-14 ENCOUNTER — Other Ambulatory Visit: Payer: Self-pay

## 2024-02-14 ENCOUNTER — Emergency Department
Admission: EM | Admit: 2024-02-14 | Discharge: 2024-02-14 | Disposition: A | Discharge status: Home / Self Care | Attending: Emergency Medicine | Admitting: Emergency Medicine

## 2024-02-14 DIAGNOSIS — K625 Hemorrhage of anus and rectum: Secondary | ICD-10-CM | POA: Insufficient documentation

## 2024-02-14 DIAGNOSIS — R197 Diarrhea, unspecified: Secondary | ICD-10-CM | POA: Insufficient documentation

## 2024-02-14 DIAGNOSIS — R109 Unspecified abdominal pain: Secondary | ICD-10-CM | POA: Diagnosis present

## 2024-02-14 LAB — CBC
HCT: 43.1 % (ref 36.0–46.0)
Hemoglobin: 14.7 g/dL (ref 12.0–15.0)
MCH: 30.6 pg (ref 26.0–34.0)
MCHC: 34.1 g/dL (ref 30.0–36.0)
MCV: 89.8 fL (ref 80.0–100.0)
Platelets: 271 10*3/uL (ref 150–400)
RBC: 4.8 MIL/uL (ref 3.87–5.11)
RDW: 12.9 % (ref 11.5–15.5)
WBC: 9.3 10*3/uL (ref 4.0–10.5)
nRBC: 0 % (ref 0.0–0.2)

## 2024-02-14 LAB — COMPREHENSIVE METABOLIC PANEL WITH GFR
ALT: 101 U/L — ABNORMAL HIGH (ref 0–44)
AST: 58 U/L — ABNORMAL HIGH (ref 15–41)
Albumin: 4.1 g/dL (ref 3.5–5.0)
Alkaline Phosphatase: 58 U/L (ref 38–126)
Anion gap: 9 (ref 5–15)
BUN: 12 mg/dL (ref 6–20)
CO2: 24 mmol/L (ref 22–32)
Calcium: 8.9 mg/dL (ref 8.9–10.3)
Chloride: 103 mmol/L (ref 98–111)
Creatinine, Ser: 0.4 mg/dL — ABNORMAL LOW (ref 0.44–1.00)
GFR, Estimated: >60 mL/min (ref >60)
Glucose, Bld: 117 mg/dL — ABNORMAL HIGH (ref 70–99)
Potassium: 4 mmol/L (ref 3.5–5.1)
Sodium: 136 mmol/L (ref 135–145)
Total Bilirubin: 0.6 mg/dL (ref 0.0–1.2)
Total Protein: 7.6 g/dL (ref 6.5–8.1)

## 2024-02-14 LAB — TYPE AND SCREEN
ABO/RH(D): AB POS
Antibody Screen: NEGATIVE

## 2024-02-14 NOTE — ED Triage Notes (Signed)
 Pt to ED via POV c/o recta bleeding. Pt reports she has had 2 episodes of bright red blood in stool. Has been having diarrhea and "upset stomach". Does not have gallbladder

## 2024-02-14 NOTE — ED Notes (Signed)
 Pt denies abd pain states she has had 2 episodes of blood in stool, bright red. Pt has hx hemorrhoids and gallbladder removed. Pt states she has had multiple episodes of diarrhea even before the bleeding started

## 2024-02-14 NOTE — Discharge Instructions (Addendum)
 Please keep an eye on your bleeding symptoms over the next several days.  Please follow-up with your primary care provider for reassessment.  Please return for any severe worsening symptoms.  You should have your primary care provider recheck your liver labs in the next week as well.

## 2024-02-14 NOTE — ED Notes (Signed)
 ED Provider at bedside.

## 2024-02-14 NOTE — ED Provider Notes (Signed)
 [facility] Provider Note    Event Date/Time   First MD Initiated Contact with Patient 02/14/24 2009     (approximate)   History   Rectal Bleeding   HPI Raven Simpson is a 33 y.o. female presenting today for abdominal pain.  Patient states she has been having diarrhea and upset stomach for the past couple of days.  Today she noted 2 episodes of bright red blood in her stool.  Currently denies any abdominal pain or nausea or vomiting.  Has had prior history of external hemorrhoids but no internal hemorrhoids.  Questionable if she has pain with bowel movements.  Prior history of cholecystectomy.     Physical Exam   Triage Vital Signs: ED Triage Vitals  Encounter Vitals Group     BP 02/14/24 1946 (!) 139/93     Systolic BP Percentile --      Diastolic BP Percentile --      Pulse Rate 02/14/24 1946 (!) 116     Resp 02/14/24 1946 20     Temp 02/14/24 1946 98.3 F (36.8 C)     Temp Source 02/14/24 1946 Oral     SpO2 02/14/24 1946 99 %     Weight 02/14/24 1947 225 lb (102.1 kg)     Height 02/14/24 1947 5\' 1"  (1.549 m)     Head Circumference --      Peak Flow --      Pain Score 02/14/24 1956 0     Pain Loc --      Pain Education --      Exclude from Growth Chart --     Most recent vital signs: Vitals:   02/14/24 1946 02/14/24 2032  BP: (!) 139/93 126/85  Pulse: (!) 116 (!) 106  Resp: 20 18  Temp: 98.3 F (36.8 C)   SpO2: 99% 97%   Physical Exam: I have reviewed the vital signs and nursing notes. General: Awake, alert, no acute distress.  Nontoxic appearing. Head:  Atraumatic, normocephalic.   ENT:  EOM intact, PERRL. Oral mucosa is pink and moist with no lesions. Neck: Neck is supple with full range of motion, No meningeal signs. Cardiovascular:  RRR, No murmurs. Peripheral pulses palpable and equal bilaterally. Respiratory:  Symmetrical chest wall expansion.  No rhonchi, rales, or wheezes.  Good air movement throughout.  No use of accessory  muscles.   Musculoskeletal:  No cyanosis or edema. Moving extremities with full ROM Abdomen:  Soft, nontender, nondistended. GU: No active bleeding.  No external hemorrhoids seen.  Nonmelanotic stool. Neuro:  GCS 15, moving all four extremities, interacting appropriately. Speech clear. Psych:  Calm, appropriate.   Skin:  Warm, dry, no rash.    ED Results / Procedures / Treatments   Labs (all labs ordered are listed, but only abnormal results are displayed) Labs Reviewed  COMPREHENSIVE METABOLIC PANEL WITH GFR - Abnormal; Notable for the following components:      Result Value   Glucose, Bld 117 (*)    Creatinine, Ser 0.40 (*)    AST 58 (*)    ALT 101 (*)    All other components within normal limits  CBC  POC OCCULT BLOOD, ED  POC URINE PREG, ED  TYPE AND SCREEN     EKG    RADIOLOGY    PROCEDURES:  Critical Care performed: No  Procedures   MEDICATIONS ORDERED IN ED: Medications - No data to display   IMPRESSION / MDM / ASSESSMENT AND PLAN /  ED COURSE  I reviewed the triage vital signs and the nursing notes.                              Differential diagnosis includes, but is not limited to, internal hemorrhoid, external hemorrhoid, diarrheal induced bleeding  Patient's presentation is most consistent with acute complicated illness / injury requiring diagnostic workup.  Patient is a 33 year old female presenting today for 2 episodes of rectal bleeding.  She denies having any abdominal pain complaints but has had diarrhea.  Also has history of external hemorrhoids.  Nontender throughout her entire abdomen.  Otherwise stable.  Laboratory workup shows unremarkable CBC.  Slight elevation in her ALT but absolutely no right upper quadrant tenderness and no recent labs to compare to.  BUN normal with no concern for upper GI bleed.  Rectal exam shows no active bleeding at this time.  Suspect most likely external hemorrhoid versus internal hemorrhoid as a source of her  symptoms.  Given no ongoing bleeding symptoms safer discharge.  No CT imaging warranted at this time and patient is agreeable with this.  Discharge with strict return precautions and follow-up with PCP in 1 week for reevaluation.     FINAL CLINICAL IMPRESSION(S) / ED DIAGNOSES   Final diagnoses:  Rectal bleeding  Diarrhea, unspecified type     Rx / DC Orders   ED Discharge Orders     None        Note:  This document was prepared using Dragon voice recognition software and may include unintentional dictation errors.   Kandee Orion, MD 02/14/24 2113

## 2024-07-15 DIAGNOSIS — Z20822 Contact with and (suspected) exposure to covid-19: Secondary | ICD-10-CM | POA: Diagnosis not present

## 2024-07-15 DIAGNOSIS — J40 Bronchitis, not specified as acute or chronic: Secondary | ICD-10-CM | POA: Diagnosis not present

## 2024-07-15 DIAGNOSIS — R079 Chest pain, unspecified: Secondary | ICD-10-CM | POA: Diagnosis not present

## 2024-07-15 DIAGNOSIS — F17210 Nicotine dependence, cigarettes, uncomplicated: Secondary | ICD-10-CM | POA: Diagnosis not present

## 2024-07-15 DIAGNOSIS — R000 Tachycardia, unspecified: Secondary | ICD-10-CM | POA: Diagnosis not present

## 2024-07-16 DIAGNOSIS — J40 Bronchitis, not specified as acute or chronic: Secondary | ICD-10-CM | POA: Diagnosis not present
